# Patient Record
Sex: Male | Born: 1945 | Hispanic: No | Smoking: Never smoker
Health system: Southern US, Community
[De-identification: ages and names within clinical notes are randomized; demographics above are authoritative.]

## PROBLEM LIST (undated history)

## (undated) DIAGNOSIS — E119 Type 2 diabetes mellitus without complications: Secondary | ICD-10-CM

## (undated) DIAGNOSIS — J449 Chronic obstructive pulmonary disease, unspecified: Secondary | ICD-10-CM

## (undated) DIAGNOSIS — C801 Malignant (primary) neoplasm, unspecified: Secondary | ICD-10-CM

## (undated) DIAGNOSIS — I10 Essential (primary) hypertension: Secondary | ICD-10-CM

## (undated) HISTORY — PX: REPLACEMENT TOTAL KNEE: SUR1224

---

## 2019-07-28 ENCOUNTER — Other Ambulatory Visit (HOSPITAL_COMMUNITY): Payer: Self-pay | Admitting: Pulmonary Disease

## 2019-07-28 DIAGNOSIS — C3431 Malignant neoplasm of lower lobe, right bronchus or lung: Secondary | ICD-10-CM

## 2019-08-01 ENCOUNTER — Other Ambulatory Visit: Payer: Self-pay

## 2019-08-01 ENCOUNTER — Ambulatory Visit (HOSPITAL_COMMUNITY)
Admission: RE | Admit: 2019-08-01 | Discharge: 2019-08-01 | Disposition: A | Discharge status: Home / Self Care | Payer: No Typology Code available for payment source | Source: Ambulatory Visit | Attending: Pulmonary Disease | Admitting: Pulmonary Disease

## 2019-08-01 DIAGNOSIS — C3431 Malignant neoplasm of lower lobe, right bronchus or lung: Secondary | ICD-10-CM | POA: Diagnosis not present

## 2019-08-01 MED ORDER — FLUDEOXYGLUCOSE F - 18 (FDG) INJECTION
12.2000 | Freq: Once | INTRAVENOUS | Status: AC | PRN
  Administered 2019-08-01: 12.2 via INTRAVENOUS

## 2019-08-03 ENCOUNTER — Emergency Department (HOSPITAL_COMMUNITY): Payer: No Typology Code available for payment source

## 2019-08-03 ENCOUNTER — Other Ambulatory Visit: Payer: Self-pay

## 2019-08-03 ENCOUNTER — Observation Stay (HOSPITAL_COMMUNITY)
Admission: EM | Admit: 2019-08-03 | Discharge: 2019-08-04 | Disposition: A | Discharge status: Home / Self Care | Payer: No Typology Code available for payment source | Attending: Internal Medicine | Admitting: Internal Medicine

## 2019-08-03 ENCOUNTER — Encounter (HOSPITAL_COMMUNITY): Payer: Self-pay

## 2019-08-03 DIAGNOSIS — J42 Unspecified chronic bronchitis: Secondary | ICD-10-CM | POA: Diagnosis not present

## 2019-08-03 DIAGNOSIS — R079 Chest pain, unspecified: Secondary | ICD-10-CM | POA: Diagnosis present

## 2019-08-03 DIAGNOSIS — C7951 Secondary malignant neoplasm of bone: Secondary | ICD-10-CM | POA: Insufficient documentation

## 2019-08-03 DIAGNOSIS — R1084 Generalized abdominal pain: Secondary | ICD-10-CM | POA: Insufficient documentation

## 2019-08-03 DIAGNOSIS — C761 Malignant neoplasm of thorax: Principal | ICD-10-CM | POA: Insufficient documentation

## 2019-08-03 DIAGNOSIS — J9611 Chronic respiratory failure with hypoxia: Secondary | ICD-10-CM | POA: Diagnosis not present

## 2019-08-03 DIAGNOSIS — E119 Type 2 diabetes mellitus without complications: Secondary | ICD-10-CM | POA: Diagnosis not present

## 2019-08-03 DIAGNOSIS — J449 Chronic obstructive pulmonary disease, unspecified: Secondary | ICD-10-CM | POA: Diagnosis not present

## 2019-08-03 DIAGNOSIS — G9341 Metabolic encephalopathy: Secondary | ICD-10-CM | POA: Insufficient documentation

## 2019-08-03 DIAGNOSIS — C787 Secondary malignant neoplasm of liver and intrahepatic bile duct: Secondary | ICD-10-CM | POA: Diagnosis not present

## 2019-08-03 DIAGNOSIS — I10 Essential (primary) hypertension: Secondary | ICD-10-CM | POA: Diagnosis not present

## 2019-08-03 DIAGNOSIS — Z20828 Contact with and (suspected) exposure to other viral communicable diseases: Secondary | ICD-10-CM | POA: Diagnosis not present

## 2019-08-03 DIAGNOSIS — R072 Precordial pain: Secondary | ICD-10-CM

## 2019-08-03 DIAGNOSIS — C799 Secondary malignant neoplasm of unspecified site: Secondary | ICD-10-CM | POA: Diagnosis not present

## 2019-08-03 DIAGNOSIS — D72825 Bandemia: Secondary | ICD-10-CM

## 2019-08-03 DIAGNOSIS — R0789 Other chest pain: Secondary | ICD-10-CM

## 2019-08-03 HISTORY — DX: Type 2 diabetes mellitus without complications: E11.9

## 2019-08-03 HISTORY — DX: Essential (primary) hypertension: I10

## 2019-08-03 HISTORY — DX: Malignant (primary) neoplasm, unspecified: C80.1

## 2019-08-03 HISTORY — DX: Chronic obstructive pulmonary disease, unspecified: J44.9

## 2019-08-03 LAB — CBC WITH DIFFERENTIAL/PLATELET
Abs Immature Granulocytes: 0.25 10*3/uL — ABNORMAL HIGH (ref 0.00–0.07)
Basophils Absolute: 0.1 10*3/uL (ref 0.0–0.1)
Basophils Relative: 1 %
Eosinophils Absolute: 1.5 10*3/uL — ABNORMAL HIGH (ref 0.0–0.5)
Eosinophils Relative: 8 %
HCT: 46.8 % (ref 39.0–52.0)
Hemoglobin: 14.2 g/dL (ref 13.0–17.0)
Immature Granulocytes: 1 %
Lymphocytes Relative: 11 %
Lymphs Abs: 2.1 10*3/uL (ref 0.7–4.0)
MCH: 27.5 pg (ref 26.0–34.0)
MCHC: 30.3 g/dL (ref 30.0–36.0)
MCV: 90.5 fL (ref 80.0–100.0)
Monocytes Absolute: 2.1 10*3/uL — ABNORMAL HIGH (ref 0.1–1.0)
Monocytes Relative: 10 %
Neutro Abs: 13.9 10*3/uL — ABNORMAL HIGH (ref 1.7–7.7)
Neutrophils Relative %: 69 %
Platelets: 323 10*3/uL (ref 150–400)
RBC: 5.17 MIL/uL (ref 4.22–5.81)
RDW: 14.1 % (ref 11.5–15.5)
WBC: 19.9 10*3/uL — ABNORMAL HIGH (ref 4.0–10.5)
nRBC: 0 % (ref 0.0–0.2)

## 2019-08-03 LAB — PROTIME-INR
INR: 1.2 (ref 0.8–1.2)
Prothrombin Time: 15 seconds (ref 11.4–15.2)

## 2019-08-03 LAB — URINALYSIS, ROUTINE W REFLEX MICROSCOPIC
Bacteria, UA: NONE SEEN
Bilirubin Urine: NEGATIVE
Glucose, UA: NEGATIVE mg/dL
Hgb urine dipstick: NEGATIVE
Ketones, ur: 20 mg/dL — AB
Leukocytes,Ua: NEGATIVE
Nitrite: POSITIVE — AB
Protein, ur: NEGATIVE mg/dL
Specific Gravity, Urine: 1.013 (ref 1.005–1.030)
pH: 6 (ref 5.0–8.0)

## 2019-08-03 LAB — COMPREHENSIVE METABOLIC PANEL
ALT: 14 U/L (ref 0–44)
AST: 14 U/L — ABNORMAL LOW (ref 15–41)
Albumin: 3.2 g/dL — ABNORMAL LOW (ref 3.5–5.0)
Alkaline Phosphatase: 68 U/L (ref 38–126)
Anion gap: 13 (ref 5–15)
BUN: 10 mg/dL (ref 8–23)
CO2: 29 mmol/L (ref 22–32)
Calcium: 9.2 mg/dL (ref 8.9–10.3)
Chloride: 94 mmol/L — ABNORMAL LOW (ref 98–111)
Creatinine, Ser: 1.01 mg/dL (ref 0.61–1.24)
GFR calc Af Amer: >60 mL/min (ref >60)
GFR calc non Af Amer: >60 mL/min (ref >60)
Glucose, Bld: 111 mg/dL — ABNORMAL HIGH (ref 70–99)
Potassium: 4.1 mmol/L (ref 3.5–5.1)
Sodium: 136 mmol/L (ref 135–145)
Total Bilirubin: 1.4 mg/dL — ABNORMAL HIGH (ref 0.3–1.2)
Total Protein: 7.5 g/dL (ref 6.5–8.1)

## 2019-08-03 LAB — TROPONIN I (HIGH SENSITIVITY)
Troponin I (High Sensitivity): 26 ng/L — ABNORMAL HIGH (ref <18)
Troponin I (High Sensitivity): 26 ng/L — ABNORMAL HIGH (ref <18)

## 2019-08-03 LAB — GLUCOSE, CAPILLARY: Glucose-Capillary: 97 mg/dL (ref 70–99)

## 2019-08-03 LAB — LACTIC ACID, PLASMA
Lactic Acid, Venous: 1.4 mmol/L (ref 0.5–1.9)
Lactic Acid, Venous: 1.6 mmol/L (ref 0.5–1.9)

## 2019-08-03 LAB — LIPASE, BLOOD: Lipase: 20 U/L (ref 11–51)

## 2019-08-03 LAB — SARS CORONAVIRUS 2 BY RT PCR (HOSPITAL ORDER, PERFORMED IN ~~LOC~~ HOSPITAL LAB): SARS Coronavirus 2: NEGATIVE

## 2019-08-03 MED ORDER — SODIUM CHLORIDE 0.9 % IV SOLN
INTRAVENOUS | Status: DC
  Administered 2019-08-03 – 2019-08-04 (×2): via INTRAVENOUS

## 2019-08-03 MED ORDER — HYDROMORPHONE HCL 1 MG/ML IJ SOLN
1.0000 mg | Freq: Once | INTRAMUSCULAR | Status: AC
  Administered 2019-08-03: 1 mg via INTRAVENOUS
  Filled 2019-08-03: qty 1

## 2019-08-03 MED ORDER — IOHEXOL 350 MG/ML SOLN
100.0000 mL | Freq: Once | INTRAVENOUS | Status: AC | PRN
  Administered 2019-08-03: 15:00:00 100 mL via INTRAVENOUS

## 2019-08-03 MED ORDER — LORAZEPAM 2 MG/ML IJ SOLN
0.5000 mg | Freq: Once | INTRAMUSCULAR | Status: AC
  Administered 2019-08-03: 14:00:00 0.5 mg via INTRAVENOUS
  Filled 2019-08-03: qty 1

## 2019-08-03 MED ORDER — FENTANYL CITRATE (PF) 100 MCG/2ML IJ SOLN
50.0000 ug | Freq: Once | INTRAMUSCULAR | Status: AC
  Administered 2019-08-03: 11:00:00 50 ug via INTRAVENOUS
  Filled 2019-08-03: qty 2

## 2019-08-03 MED ORDER — HYDROCODONE-ACETAMINOPHEN 7.5-325 MG/15ML PO SOLN
10.0000 mL | ORAL | Status: DC | PRN
  Administered 2019-08-03 – 2019-08-04 (×2): 10 mL via ORAL
  Filled 2019-08-03 (×2): qty 15

## 2019-08-03 MED ORDER — ONDANSETRON HCL 4 MG/2ML IJ SOLN
4.0000 mg | Freq: Four times a day (QID) | INTRAMUSCULAR | Status: DC | PRN
  Administered 2019-08-03: 4 mg via INTRAVENOUS
  Filled 2019-08-03: qty 2

## 2019-08-03 MED ORDER — SODIUM CHLORIDE 0.9 % IV BOLUS
500.0000 mL | Freq: Once | INTRAVENOUS | Status: AC
  Administered 2019-08-03: 500 mL via INTRAVENOUS

## 2019-08-03 MED ORDER — INSULIN ASPART 100 UNIT/ML ~~LOC~~ SOLN
0.0000 [IU] | Freq: Three times a day (TID) | SUBCUTANEOUS | Status: DC
  Administered 2019-08-04: 13:00:00 1 [IU] via SUBCUTANEOUS

## 2019-08-03 MED ORDER — PANTOPRAZOLE SODIUM 40 MG PO TBEC
40.0000 mg | DELAYED_RELEASE_TABLET | Freq: Every day | ORAL | Status: DC
  Administered 2019-08-04: 10:00:00 40 mg via ORAL
  Filled 2019-08-03: qty 1

## 2019-08-03 MED ORDER — SIMVASTATIN 10 MG PO TABS: 10.0000 mg | ORAL_TABLET | Freq: Every day | ORAL | Status: DC

## 2019-08-03 MED ORDER — PREDNISONE 10 MG PO TABS
5.0000 mg | ORAL_TABLET | Freq: Every day | ORAL | Status: DC
  Administered 2019-08-04: 5 mg via ORAL
  Filled 2019-08-03: qty 1

## 2019-08-03 MED ORDER — ATENOLOL 25 MG PO TABS
12.5000 mg | ORAL_TABLET | Freq: Every day | ORAL | Status: DC
  Administered 2019-08-04: 10:00:00 12.5 mg via ORAL
  Filled 2019-08-03: qty 1

## 2019-08-03 MED ORDER — ENOXAPARIN SODIUM 40 MG/0.4ML ~~LOC~~ SOLN
40.0000 mg | SUBCUTANEOUS | Status: DC
  Administered 2019-08-03: 23:00:00 40 mg via SUBCUTANEOUS
  Filled 2019-08-03: qty 0.4

## 2019-08-03 MED ORDER — FENTANYL CITRATE (PF) 100 MCG/2ML IJ SOLN
100.0000 ug | Freq: Once | INTRAMUSCULAR | Status: AC
  Administered 2019-08-03: 15:00:00 100 ug via INTRAVENOUS
  Filled 2019-08-03: qty 2

## 2019-08-03 MED ORDER — SODIUM CHLORIDE 0.9 % IV SOLN
1.0000 g | Freq: Once | INTRAVENOUS | Status: AC
  Administered 2019-08-03: 17:00:00 1 g via INTRAVENOUS
  Filled 2019-08-03: qty 10

## 2019-08-03 MED ORDER — ASPIRIN EC 81 MG PO TBEC
81.0000 mg | DELAYED_RELEASE_TABLET | Freq: Every day | ORAL | Status: DC
  Administered 2019-08-04: 81 mg via ORAL
  Filled 2019-08-03: qty 1

## 2019-08-03 NOTE — H&P (Signed)
History and Physical:    Andrew Roman   UDJ:[id number] DOB: [birth year] DOA: 08/03/2019  Referring MD/provider: Dr. Roderic Palau PCP: [facility]   Patient coming from: Home  Chief Complaint: Somnolence after receiving Dilaudid in ED  History of Present Illness:   Andrew Roman is an 73 y.o. male with past medical history significant for hypertension diabetes and interstitial lung disease who was in his usual state of health until last month when an incidental CT done at the [facility] found a lung mass.  Patient was supposed to undergo further work-up however his son moved him from [place] to [place] to live closer by to him so the work-up was never done.  Over the past couple of days patient has had increasing pain in his substernal area as well as pain in his right upper quadrant.  According to his son patient does not like to take pain medications and had resisted coming to the hospital.  He has had significantly decreased p.o. intake over the past couple of days and he has not eaten anything or had anything to drink today.  Today apparently the pain was so bad that the patient requested an ambulance be called.  IHistory is entirely per patient's son Delfino Lovett as patient was unable to provide history.  ED Course:  In the ED patient was initially treated with fentanyl 100 with modest improvement in pain.  He underwent chest abdomen and pelvic CT which showed widely metastatic disease in chest and abdomen with large mets in his liver which would be consistent with his right upper quadrant pain.  And was to discharge patient home for outpatient follow-up however patient received 1 mg of Dilaudid prior to going home and was found to be very sleepy and unable to go home safely.  Because he was in so much pain it was felt that Narcan would not be appropriate at this time so tried hospitalist were called to admit patient for observation.    ROS:   ROS  Patient unable to  provide review of systems  Past Medical History:   Past Medical History:  Diagnosis Date   Cancer ([place])    lung mass   COPD (chronic obstructive pulmonary disease) ([place])    lung disease   Diabetes mellitus without complication ([place])    Hypertension     Past Surgical History:   History reviewed. No pertinent surgical history.  Social History:   Social History   Socioeconomic History   Marital status: Unknown    Spouse name: Not on file   Number of children: Not on file   Years of education: Not on file   Highest education level: Not on file  Occupational History   Not on file  Social Needs   Financial resource strain: Not on file   Food insecurity    Worry: Not on file    Inability: Not on file   Transportation needs    Medical: Not on file    Non-medical: Not on file  Tobacco Use   Smoking status: Never Smoker   Smokeless tobacco: Never Used  Substance and Sexual Activity   Alcohol use: Never    Frequency: Never   Drug use: Never   Sexual activity: Not on file  Lifestyle   Physical activity    Days per week: Not on file    Minutes per session: Not on file   Stress: Not on file  Relationships   Social connections    Talks on  phone: Not on file    Gets together: Not on file    Attends religious service: Not on file    Active member of club or organization: Not on file    Attends meetings of clubs or organizations: Not on file    Relationship status: Not on file   Intimate partner violence    Fear of current or ex partner: Not on file    Emotionally abused: Not on file    Physically abused: Not on file    Forced sexual activity: Not on file  Other Topics Concern   Not on file  Social History Narrative   Not on file    Allergies   Morphine and related  Family history:   No family history on file.  Current Medications:   Prior to Admission medications   Medication Sig Start Date End Date Taking? Authorizing Provider    aspirin EC 81 MG tablet Take 81 mg by mouth daily.   Yes [provider]  atenolol (TENORMIN) 25 MG tablet Take 12.5 mg by mouth daily.    Yes [provider]  insulin NPH-regular Human (70-30) 100 UNIT/ML injection Inject 8 Units into the skin 2 (two) times daily with a meal.   Yes [provider]  Multiple Vitamin (MULTIVITAMIN) tablet Take 1 tablet by mouth daily.   Yes [provider]  omeprazole (PRILOSEC) 40 MG capsule Take 40 mg by mouth daily.   Yes [provider]  predniSONE (DELTASONE) 5 MG tablet Take 5 mg by mouth daily with breakfast.   Yes [provider]  simvastatin (ZOCOR) 10 MG tablet Take 10 mg by mouth at bedtime.   Yes [provider]    Physical Exam:   Vitals:   08/03/19 1700 08/03/19 1730 08/03/19 1743 08/03/19 1800  BP: 123/84 118/80 118/80 123/84  Pulse: (!) 120 (!) 115 (!) 117 (!) 114  Resp: (!) 42 18 (!) 22 17  Temp:   98.7 F (37.1 C)   TempSrc:   Oral   SpO2: (!) 89% 98% 100% 100%  Weight:      Height:         Physical Exam: Blood pressure 123/84, pulse (!) 114, temperature 98.7 F (37.1 C), temperature source Oral, resp. rate 17, height 5\' 7"  (1.702 m), weight 83.9 kg, SpO2 100 %. Gen: Elderly man looking older than stated age lying at 30 degrees in no acute respiratory distress.  He does open eyes to voice however his eyes are somewhat unfocused, goes back to sleep. Eyes: Sclerae anicteric. Conjunctiva mildly injected. Chest: Decreased air entry bilaterally likely secondary to decreased inspiratory effort CV: Distant, regular, no audible murmurs. Abdomen: Patient has normoactive bowel sounds.  His abdomen is soft.  He has tenderness to light palpation in the right upper quadrant. Extremities: No edema.  Skin: Warm and dry. No rashes, lesions or wounds. Neuro: Patient is sleepy.  GCS is 10.  Patient apparently was awake and alert and coherent until he got the Dilaudid per his son's  report.   Psych: Unable to assess patient psychiatric status.  Data Review:    Labs: Basic Metabolic Panel: Recent Labs  Lab 08/03/19 1111  NA 136  K 4.1  CL 94*  CO2 29  GLUCOSE 111*  BUN 10  CREATININE 1.01  CALCIUM 9.2   Liver Function Tests: Recent Labs  Lab 08/03/19 1111  AST 14*  ALT 14  ALKPHOS 68  BILITOT 1.4*  PROT 7.5  ALBUMIN 3.2*  Recent Labs  Lab 08/03/19 1111  LIPASE 20   No results for input(s): AMMONIA in the last 168 hours. CBC: Recent Labs  Lab 08/03/19 1111  WBC 19.9*  NEUTROABS 13.9*  HGB 14.2  HCT 46.8  MCV 90.5  PLT 323   Cardiac Enzymes: No results for input(s): CKTOTAL, CKMB, CKMBINDEX, TROPONINI in the last 168 hours.  BNP (last 3 results) No results for input(s): PROBNP in the last 8760 hours. CBG: No results for input(s): GLUCAP in the last 168 hours.  Urinalysis    Component Value Date/Time   COLORURINE YELLOW 08/03/2019 1112   APPEARANCEUR CLEAR 08/03/2019 1112   LABSPEC 1.013 08/03/2019 1112   PHURINE 6.0 08/03/2019 1112   GLUCOSEU NEGATIVE 08/03/2019 1112   HGBUR NEGATIVE 08/03/2019 1112   BILIRUBINUR NEGATIVE 08/03/2019 1112   KETONESUR 20 (A) 08/03/2019 1112   PROTEINUR NEGATIVE 08/03/2019 1112   NITRITE POSITIVE (A) 08/03/2019 1112   LEUKOCYTESUR NEGATIVE 08/03/2019 1112      Radiographic Studies: Ct Angio Chest Pe W And/or Wo Contrast  Result Date: 08/03/2019 CLINICAL DATA:  Right-sided chest pain. EXAM: CT ANGIOGRAPHY CHEST WITH CONTRAST TECHNIQUE: Multidetector CT imaging of the chest was performed using the standard protocol during bolus administration of intravenous contrast. Multiplanar CT image reconstructions and MIPs were obtained to evaluate the vascular anatomy. CONTRAST:  178mL OMNIPAQUE IOHEXOL 350 MG/ML SOLN COMPARISON:  Chest x-ray dated 08/03/2019 FINDINGS: Cardiovascular: There are no pulmonary emboli. However, there is a posterior mediastinal mass at the level of the left atrium which  invades the left atrium. There is a 3 x 2 x 2 cm irregular mass in the posterior aspect of the left atrium representing a direct extension of the tumor in the posterior mediastinum into the atrium. Overall heart size is normal. No pericardial effusion. Slight coronary artery calcifications. Mediastinum/Nodes: There is right hilar and subcarinal adenopathy. There is a 6.6 x 6.6 x 4.4 cm posterior mediastinal mass immediately posterior to the left atrium extending to the diaphragmatic hiatus immediately adjacent to the esophagus. The fat planes between the distal esophagus and this mass are obscured. The thyroid gland and trachea appear normal. Lungs/Pleura: The mass in the posterior mediastinum extends into the left hilum. There is narrowing of the right middle lobe and lower lobe bronchi. There is peripheral interstitial disease throughout both lungs, most prominent posteriorly at the right lung base. No discrete effusions. Upper Abdomen: There is adenopathy around the porta hepatis and around the inferior vena cava just below the liver. Musculoskeletal: There is a slight anterior wedge deformity of there is also a 9 mm lytic lesion in the superior aspect of T7, likely a benign Schmorl's node. PET scan does show metastatic disease in the sclerotic left pedicle of T7. There is a destructive expansile lesion of the anterior aspect of the right third rib. The PET scan demonstrated numerous metastatic lesions in the ribs and spine as well as in the scapulae. There is a subtle lytic lesion in the posteromedial aspect of the left ninth rib. Review of the MIP images confirms the above findings. IMPRESSION: 1. No pulmonary emboli. 2. Large mass in the posterior mediastinum extending into the left hilum with invasion of the left atrium. 3. Right hilar and subcarinal adenopathy consistent with metastatic disease. 4. Metastatic disease to the bones as described. 5. Probable benign Schmorl's node in the superior aspect of the  T7 vertebral body. Electronically Signed   By: Lorriane Shire M.D.   On: 08/03/2019 15:37  Ct Abdomen Pelvis W Contrast  Result Date: 08/03/2019 CLINICAL DATA:  Acute generalized abdominal pain. Foot EXAM: CT ABDOMEN AND PELVIS WITH CONTRAST TECHNIQUE: Multidetector CT imaging of the abdomen and pelvis was performed using the standard protocol following bolus administration of intravenous contrast. CONTRAST:  123mL OMNIPAQUE IOHEXOL 350 MG/ML SOLN COMPARISON:  None. FINDINGS: Lower chest: Posterior mediastinal mass invading the left atrium. Adenopathy adjacent to the distal esophagus just above the diaphragmatic hiatus. Hepatobiliary: There are 3 discrete metastases in the liver which correlate with the PET-CT scan. There is a 2 cm mass in the posterior aspect of the dome of the right lobe of the liver. There is a 17 mm lesion in the periphery of the right lobe and there is a 16 mm lesion in the inferior aspect of the left lobe of the liver. There is a single calcified gallstone. No dilated bile ducts. Prominent caudate lobe. Pancreas: Normal. Spleen: Normal. 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 Left adrenal gland appears normal. Bilateral renal cysts as demonstrated on the prior PET-CT scan. No hydronephrosis. Bladder appears normal. Stomach/Bowel: Stomach is within normal limits. Appendix appears normal. No evidence of bowel wall thickening, distention, or inflammatory changes. Multiple diverticula in the distal colon. The appendix is not discretely identified. Vascular/Lymphatic: Aortic atherosclerosis. Multiple enlarged lymph nodes in the porta hepatis and adjacent to the celiac axis. Reproductive: Prostate is unremarkable. Other: No abdominal wall hernia or abnormality. No abdominopelvic ascites. Musculoskeletal: The multiple metastatic lesions in the spine, pelvic bones, and proximal left femur are quite subtle on the CT scan. While the proximal left femur lesions has a vague area of  increased density best seen on image 102 of series 5. No discrete destructive bone lesions. IMPRESSION: 1. 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 2. Multiple enlarged lymph nodes in the porta hepatis and adjacent to the celiac axis. 3. Multiple metastatic lesions in the spine, pelvic bones, and proximal left femur. 4. No acute abnormalities in the abdomen as compared to the prior PET-CT. Aortic Atherosclerosis (ICD10-I70.0). Electronically Signed   By: Lorriane Shire M.D.   On: 08/03/2019 15:47   Nm Pet Image Initial (pi) Skull Base To Thigh  Result Date: 08/01/2019 CLINICAL DATA:  Initial treatment strategy for right lower lobe lung malignancy. EXAM: NUCLEAR MEDICINE PET SKULL BASE TO THIGH TECHNIQUE: 12.2 mCi F-18 FDG was injected intravenously. Full-ring PET imaging was performed from the skull base to thigh after the radiotracer. CT data was obtained and used for attenuation correction and anatomic localization. Fasting blood glucose: 110 mg/dl COMPARISON:  None. FINDINGS: Mediastinal blood pool activity: SUV max 3.3 Liver activity: SUV max NA NECK: Hypermetabolic mildly enlarged bilateral level II neck lymph nodes, for example measuring 1.0 cm on the left with max SUV 4.8 (series 3/image 36). Small focus of hypermetabolism in the right submandibular musculature with max SUV 7.0, without discrete CT correlate. Incidental CT findings: none CHEST: Hypermetabolic solid 5.7 x 4.8 cm lung mass in the paramediastinal right lower lobe with probable direct mediastinal invasion with max SUV 16.4 (series 3/image 88). There is patchy irregular thickening of peribronchovascular interstitium throughout both lungs without additional foci of pulmonary hypermetabolism. Hypermetabolic 0.7 cm right axillary node with max SUV 3.9 (series 3/image 58). Hypermetabolic enlarged 1.3 cm subcarinal node with max SUV 7.7 (series 3/image 75). No hypermetabolic left axillary or hilar nodes. Hypermetabolic 1.1  cm soft tissue nodule posterior to the right pectoralis musculature with max SUV 5.3 (series 3/image 95).  Incidental CT findings: Coronary atherosclerosis. Mildly atherosclerotic nonaneurysmal thoracic aorta. ABDOMEN/PELVIS: Three hypermetabolic liver lesions scattered in the liver, including a 1.3 cm posterior right liver lesion with max SUV 9.4 (series 3/image 100), a posterior right liver dome 1.3 cm lesion with max SUV 7.6 (series 3/image 87) and a 1.1 cm segment 3 left liver lobe lesion with max SUV 7.7 (series 3/image 116). Hypermetabolic 2.4 cm right adrenal nodule with max SUV 15.7 (series 3/image 117). No hypermetabolic left adrenal nodules. Two hypermetabolic soft tissue implants in the omentum, largest 1.4 cm in the right omentum with max SUV 8.4 (series 3/image 159). Hypermetabolic high right retroperitoneal 1.2 cm lymph node with max SUV 4.9 (series 3/image 117). No additional hypermetabolic abdominopelvic nodes. Bilateral medial gluteal musculature soft tissue foci of hypermetabolism, largest 1.1 cm on the left with max SUV 7.4 (series 3/image 253). No abnormal hypermetabolic activity within the pancreas or spleen. Incidental CT findings: Cholelithiasis. Atherosclerotic nonaneurysmal abdominal aorta. Punctate nonobstructing interpolar right renal stone. Mild sigmoid diverticulosis. SKELETON: Numerous hypermetabolic osseous lesions scattered throughout the axial and proximal appendicular skeleton, which are relatively CT occult. Representative lesions as follows: -inferior right scapular lesion with max SUV 11.6 -anterior left third rib lesion with max SUV 13.5 -proximal right humeral metaphysis lesion with max SUV 10.6 -right iliac crest lesion with max SUV 8.6 -left femoral neck lesion with max SUV 9.2 -L1 vertebral lesion with max SUV 13.4 Incidental CT findings: none IMPRESSION: 1. Intensely hypermetabolic (max SUV 64.4) 5.7 cm lung mass in the paramediastinal right lower lobe, compatible with  primary bronchogenic carcinoma, with probable direct mediastinal invasion. 2. Hypermetabolic subcarinal lymphadenopathy. 3. Irregular patchy thickening of the peribronchovascular interstitium throughout both lungs, nonspecific, lymphangitic carcinomatosis not excluded. 4. Hypermetabolic distant lymphadenopathy in the bilateral level II neck, right axilla and high right retroperitoneum. 5. Multiple hypermetabolic liver metastases. 6. Hypermetabolic right adrenal metastasis. 7. Hypermetabolic omental metastases. 8. Scattered hypermetabolic soft tissue metastases in right chest wall and bilateral gluteal musculature. 9. Widespread hypermetabolic osseous metastatic disease throughout the axial and proximal appendicular skeleton. Hypermetabolic left femoral neck metastasis places the patient at risk for pathologic fracture. 10. PET/CT staging is stage IVB (T4 N2 M1c). 11. Chronic findings include: Aortic Atherosclerosis (ICD10-I70.0). Coronary atherosclerosis. Cholelithiasis. Mild sigmoid diverticulosis. Electronically Signed   By: Ilona Sorrel M.D.   On: 08/01/2019 22:18   Dg Chest Portable 1 View  Result Date: 08/03/2019 CLINICAL DATA:  Chest pain. EXAM: PORTABLE CHEST 1 VIEW COMPARISON:  None. FINDINGS: Mild cardiomegaly is noted. Hypoinflation of the lungs is noted. No pneumothorax is noted. Small pleural effusions may be present. Bilateral lung opacities are noted concerning for possible edema or inflammation. Bony thorax is unremarkable. IMPRESSION: Hypoinflation of the lungs is noted. Small pleural effusions may be present. Bilateral interstitial lung opacities are noted which may represent edema or inflammation. Electronically Signed   By: Marijo Conception M.D.   On: 08/03/2019 11:40    EKG: Independently reviewed.  Sinus tach at 100.  Right axis deviation.  Q in 3 with insignificant Q in F.  No acute ST-T wave changes.  Poor R wave progression.   Assessment/Plan:   Principal Problem:   Metastatic  cancer ([place]) Active Problems:   COPD (chronic obstructive pulmonary disease) ([facility])   Hypertension   Diabetes mellitus without complication ([place])  73 yo M presents with chest pain, found to have widely metastatic disease in chest and abdomen. Has not tolerated narcotics pain meds given in ED well, is  quite somnolent now and unable to go home safely. Will admit overnight for observation.   ALTERED MENTAL STATUS Patient has been doing well until he received the Dilaudid 1 mg No indication for Narcan as patient is in significant pain and he is able to protect his airway and is slowly waking up apparently. Will try to avoid iatrogenesis, keep head of bed 30 degrees to avoid aspiration, falls risk protocol.  METASTATIC CANCER Patient was discussed with Dr. Cheri Kearns who will see the patient in consultation in the morning. Overnight if patient needs pain medications he might do better with fentanyl and then Dilaudid.  LEUKOCYTOSIS Most likely secondary to malignancy. No real evidence for any infection although he does have nitrite positive in the urine although no white cells and no symptoms so will not treat at present.  DM Patient has not taken his insulin today, nor has he had any food or water all day today. Blood sugar is only 111 today despite that. Given that patient is only on a very low dose of insulin of 70/30 insulin 8 u BID, will place patient on sliding scale insulin AC at bedtime to control blood sugars in house.   Consider defying home insulin dosage upon discharge if warranted. Will also provide low-dose hydration today given that he has had no oral intake at all all day and he does have some tachycardia.  HTN Continue atenolol per home doses, first dose to be given in the morning.  INTERSTITIAL LUNG DISEASE  Continue prednisone 5 mg daily to be administered in the morning, no need for stress dose steroids at this point.    Other information:   DVT prophylaxis:  Lovenox ordered. Code Status: Full code. Family Communication: Spoke with patient's son Delfino Lovett at [employer].   Disposition Plan: Home tomorrow Consults called: Oncology to be seen in the morning Admission status: Observation   The medical decision making is of moderate complexity, therefore this is a level 2 visit.  Dewaine Oats Tublu Aidynn Krenn [facility]  If 7PM-7AM, please contact night-coverage [email or web address] Password [email or web address] 08/03/2019, 6:11 PM

## 2019-08-03 NOTE — ED Provider Notes (Signed)
Emergency Department Provider Note   I have reviewed the triage vital signs and the nursing notes.   HISTORY  Chief Complaint Chest Pain   HPI Andrew Roman is a 73 y.o. male with PMH of DM, HTN, COPD on 5L home O2, and new chest mass currently establishing care with oncology presents to the emergency department with worsening right-sided chest pain, decreased appetite, generalized weakness.  Patient is here with his son at bedside who checks in on him frequently.  He is primarily followed by the [facility].  Patient states he has intermittently had pain in his chest but overnight the pain became worse which prompted him to alert his son who called EMS.  The son states he is not been eating or drinking much over the last several days and has not had his insulin this morning.  Patient denies any fevers or shaking chills.  Denies cough or hemoptysis.  Son states that they are waiting on the results of the PET scan and are meeting with oncology later this week.  He is not currently on chemotherapy.  He denies abdominal pain, vomiting, diarrhea.  He does have some nausea. Denies sick contacts.    Past Medical History:  Diagnosis Date   Cancer ([place])    lung mass   COPD (chronic obstructive pulmonary disease) ([place])    lung disease   Diabetes mellitus without complication ([place])    Hypertension     There are no active problems to display for this patient.   History reviewed. No pertinent surgical history.  Allergies Morphine and related  No family history on file.  Social History Social History   Tobacco Use   Smoking status: Never Smoker   Smokeless tobacco: Never Used  Substance Use Topics   Alcohol use: Never    Frequency: Never   Drug use: Never    Review of Systems  Constitutional: No fever/chills. Positive generalized weakness.  Eyes: No visual changes. ENT: No sore throat. Cardiovascular: Positive chest pain. Respiratory: Denies shortness  of breath. Gastrointestinal: Mild right sided abdominal pain. Positive nausea, no vomiting.  No diarrhea.  No constipation. Genitourinary: Negative for dysuria. Musculoskeletal: Negative for back pain. Skin: Negative for rash. Neurological: Negative for headaches, focal weakness or numbness.  10-point ROS otherwise negative.  ____________________________________________   PHYSICAL EXAM:  VITAL SIGNS: ED Triage Vitals  Enc Vitals Group     BP 08/03/19 1100 (!) 141/85     Pulse Rate 08/03/19 1100 (!) 103     Resp 08/03/19 1100 (!) 22     Temp 08/03/19 1100 98.6 F (37 C)     Temp Source 08/03/19 1100 Oral     SpO2 08/03/19 1100 100 %     Weight 08/03/19 1056 185 lb (83.9 kg)     Height 08/03/19 1056 5\' 7"  (1.702 m)   Constitutional: Alert and oriented. Well appearing and in no acute distress. Eyes: Conjunctivae are normal.  Head: Atraumatic. Nose: No congestion/rhinnorhea. Mouth/Throat: Mucous membranes are moist.   Neck: No stridor.   Cardiovascular: Tachycardia. Good peripheral circulation. Grossly normal heart sounds.   Respiratory: Normal respiratory effort.  No retractions. Lungs CTAB. Gastrointestinal: Soft with mild right abdominal tenderness to palpation. No distention.  Musculoskeletal: No lower extremity tenderness nor edema. No gross deformities of extremities. Neurologic:  Normal speech and language.  Skin:  Skin is warm, dry and intact. No rash noted.  ____________________________________________   LABS (all labs ordered are listed, but only abnormal results are  displayed)  Labs Reviewed  COMPREHENSIVE METABOLIC PANEL - Abnormal; Notable for the following components:      Result Value   Chloride 94 (*)    Glucose, Bld 111 (*)    Albumin 3.2 (*)    AST 14 (*)    Total Bilirubin 1.4 (*)    All other components within normal limits  CBC WITH DIFFERENTIAL/PLATELET - Abnormal; Notable for the following components:   WBC 19.9 (*)    Neutro Abs 13.9 (*)     Monocytes Absolute 2.1 (*)    Eosinophils Absolute 1.5 (*)    Abs Immature Granulocytes 0.25 (*)    All other components within normal limits  URINALYSIS, ROUTINE W REFLEX MICROSCOPIC - Abnormal; Notable for the following components:   Ketones, ur 20 (*)    Nitrite POSITIVE (*)    All other components within normal limits  TROPONIN I (HIGH SENSITIVITY) - Abnormal; Notable for the following components:   Troponin I (High Sensitivity) 26 (*)    All other components within normal limits  TROPONIN I (HIGH SENSITIVITY) - Abnormal; Notable for the following components:   Troponin I (High Sensitivity) 26 (*)    All other components within normal limits  CULTURE, BLOOD (ROUTINE X 2)  CULTURE, BLOOD (ROUTINE X 2)  SARS CORONAVIRUS 2 (HOSPITAL ORDER, [place] [facility])  URINE CULTURE  LIPASE, BLOOD  LACTIC ACID, PLASMA  LACTIC ACID, PLASMA  PROTIME-INR   ____________________________________________  EKG   EKG Interpretation  Date/Time:  Wednesday August 03 2019 10:58:12 EDT Ventricular Rate:  105 PR Interval:    QRS Duration: 102 QT Interval:  349 QTC Calculation: 459 R Axis:   132 Text Interpretation:  Sinus tachycardia Right axis deviation Low voltage, precordial leads T wave flattening laterally.  No prior for comparison. No STEMI.  Confirmed by Nanda Quinton [phone]) on 08/03/2019 11:03:24 AM       ____________________________________________  RADIOLOGY  Ct Angio Chest Pe W And/or Wo Contrast  Result Date: 08/03/2019 CLINICAL DATA:  Right-sided chest pain. EXAM: CT ANGIOGRAPHY CHEST WITH CONTRAST TECHNIQUE: Multidetector CT imaging of the chest was performed using the standard protocol during bolus administration of intravenous contrast. Multiplanar CT image reconstructions and MIPs were obtained to evaluate the vascular anatomy. CONTRAST:  160mL OMNIPAQUE IOHEXOL 350 MG/ML SOLN COMPARISON:  Chest x-ray dated 08/03/2019 FINDINGS: Cardiovascular: There are  no pulmonary emboli. However, there is a posterior mediastinal mass at the level of the left atrium which invades the left atrium. There is a 3 x 2 x 2 cm irregular mass in the posterior aspect of the left atrium representing a direct extension of the tumor in the posterior mediastinum into the atrium. Overall heart size is normal. No pericardial effusion. Slight coronary artery calcifications. Mediastinum/Nodes: There is right hilar and subcarinal adenopathy. There is a 6.6 x 6.6 x 4.4 cm posterior mediastinal mass immediately posterior to the left atrium extending to the diaphragmatic hiatus immediately adjacent to the esophagus. The fat planes between the distal esophagus and this mass are obscured. The thyroid gland and trachea appear normal. Lungs/Pleura: The mass in the posterior mediastinum extends into the left hilum. There is narrowing of the right middle lobe and lower lobe bronchi. There is peripheral interstitial disease throughout both lungs, most prominent posteriorly at the right lung base. No discrete effusions. Upper Abdomen: There is adenopathy around the porta hepatis and around the inferior vena cava just below the liver. Musculoskeletal: There is a slight anterior wedge  deformity of there is also a 9 mm lytic lesion in the superior aspect of T7, likely a benign Schmorl's node. PET scan does show metastatic disease in the sclerotic left pedicle of T7. There is a destructive expansile lesion of the anterior aspect of the right third rib. The PET scan demonstrated numerous metastatic lesions in the ribs and spine as well as in the scapulae. There is a subtle lytic lesion in the posteromedial aspect of the left ninth rib. Review of the MIP images confirms the above findings. IMPRESSION: 1. No pulmonary emboli. 2. Large mass in the posterior mediastinum extending into the left hilum with invasion of the left atrium. 3. Right hilar and subcarinal adenopathy consistent with metastatic disease. 4.  Metastatic disease to the bones as described. 5. Probable benign Schmorl's node in the superior aspect of the T7 vertebral body. Electronically Signed   By: Lorriane Shire M.D.   On: 08/03/2019 15:37   Ct Abdomen Pelvis W Contrast  Result Date: 08/03/2019 CLINICAL DATA:  Acute generalized abdominal pain. Foot EXAM: CT ABDOMEN AND PELVIS WITH CONTRAST TECHNIQUE: Multidetector CT imaging of the abdomen and pelvis was performed using the standard protocol following bolus administration of intravenous contrast. CONTRAST:  166mL OMNIPAQUE IOHEXOL 350 MG/ML SOLN COMPARISON:  None. FINDINGS: Lower chest: Posterior mediastinal mass invading the left atrium. Adenopathy adjacent to the distal esophagus just above the diaphragmatic hiatus. Hepatobiliary: There are 3 discrete metastases in the liver which correlate with the PET-CT scan. There is a 2 cm mass in the posterior aspect of the dome of the right lobe of the liver. There is a 17 mm lesion in the periphery of the right lobe and there is a 16 mm lesion in the inferior aspect of the left lobe of the liver. There is a single calcified gallstone. No dilated bile ducts. Prominent caudate lobe. Pancreas: Normal. Spleen: Normal. 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 Left adrenal gland appears normal. Bilateral renal cysts as demonstrated on the prior PET-CT scan. No hydronephrosis. Bladder appears normal. Stomach/Bowel: Stomach is within normal limits. Appendix appears normal. No evidence of bowel wall thickening, distention, or inflammatory changes. Multiple diverticula in the distal colon. The appendix is not discretely identified. Vascular/Lymphatic: Aortic atherosclerosis. Multiple enlarged lymph nodes in the porta hepatis and adjacent to the celiac axis. Reproductive: Prostate is unremarkable. Other: No abdominal wall hernia or abnormality. No abdominopelvic ascites. Musculoskeletal: The multiple metastatic lesions in the spine, pelvic bones, and  proximal left femur are quite subtle on the CT scan. While the proximal left femur lesions has a vague area of increased density best seen on image 102 of series 5. No discrete destructive bone lesions. IMPRESSION: 1. 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 2. Multiple enlarged lymph nodes in the porta hepatis and adjacent to the celiac axis. 3. Multiple metastatic lesions in the spine, pelvic bones, and proximal left femur. 4. No acute abnormalities in the abdomen as compared to the prior PET-CT. Aortic Atherosclerosis (ICD10-I70.0). Electronically Signed   By: Lorriane Shire M.D.   On: 08/03/2019 15:47   Dg Chest Portable 1 View  Result Date: 08/03/2019 CLINICAL DATA:  Chest pain. EXAM: PORTABLE CHEST 1 VIEW COMPARISON:  None. FINDINGS: Mild cardiomegaly is noted. Hypoinflation of the lungs is noted. No pneumothorax is noted. Small pleural effusions may be present. Bilateral lung opacities are noted concerning for possible edema or inflammation. Bony thorax is unremarkable. IMPRESSION: Hypoinflation of the lungs is noted. Small pleural effusions  may be present. Bilateral interstitial lung opacities are noted which may represent edema or inflammation. Electronically Signed   By: Marijo Conception M.D.   On: 08/03/2019 11:40    ____________________________________________   PROCEDURES  Procedure(s) performed:   Procedures  None ____________________________________________   INITIAL IMPRESSION / ASSESSMENT AND PLAN / ED COURSE  Pertinent labs & imaging results that were available during my care of the patient were reviewed by me and considered in my medical decision making (see chart for details).   Patient with new chest mass presents to the emergency department with worsening chest pain, decreased p.o. intake, nausea.  Patient is tachycardic with mild tachypnea on arrival.  He is on his baseline home oxygen.  Lower suspicion for COVID-19.  PE is a consideration along  with ACS, acute dehydration.  Will obtain screening lab work, chest x-ray, COVID testing.  Patient will likely require CT imaging following labs.  01:27 PM  Called to bedside with patient refusing CT scan.  He tells me that he is claustrophobic and "I just want to get out of here."  Had Maylynn Orzechowski discussion regarding my concern with her possible severe underlying disease process and that CT would help [place] to diagnose that accurately.  Patient's son is at bedside during discussion.  Patient agrees to accept a low dose Ativan for claustrophobia and CT and return to attempt the scan.   UA with positive nitrite but no other findings to suggest acute UTI. Will send for Cx.   CT pending. Patient sent back with Ativan for anxiety symptoms. Care transferred to Dr. Roderic Palau.  ____________________________________________  FINAL CLINICAL IMPRESSION(S) / ED DIAGNOSES  Final diagnoses:  Precordial pain     MEDICATIONS GIVEN DURING THIS VISIT:  Medications  HYDROmorphone (DILAUDID) injection 1 mg (has no administration in time range)  cefTRIAXone (ROCEPHIN) 1 g in sodium chloride 0.9 % 100 mL IVPB (has no administration in time range)  sodium chloride 0.9 % bolus 500 mL (0 mLs Intravenous Stopped 08/03/19 1224)  fentaNYL (SUBLIMAZE) injection 50 mcg (50 mcg Intravenous Given 08/03/19 1125)  iohexol (OMNIPAQUE) 350 MG/ML injection 100 mL (100 mLs Intravenous Contrast Given 08/03/19 1455)  LORazepam (ATIVAN) injection 0.5 mg (0.5 mg Intravenous Given 08/03/19 1341)  fentaNYL (SUBLIMAZE) injection 100 mcg (100 mcg Intravenous Given 08/03/19 1432)    Note:  This document was prepared using Dragon voice recognition software and may include unintentional dictation errors.  Nanda Quinton, MD Emergency Medicine    Tevon Berhane, [facility], MD 08/03/19 [phone]

## 2019-08-03 NOTE — ED Triage Notes (Addendum)
EMS reports pt had a mass in chest approx 4 weeks ago that is still being evaluated.  EMS reports pt hasn't eaten or drank much in the past 3-4 days.  C/O generalized weakness and r sided chest pain.  Pt treated at [facility].  Per ems, pt wears 5liters of o2 at home.

## 2019-08-03 NOTE — Discharge Instructions (Signed)
Follow-up with Dr. Delton Coombes tomorrow here at 12:30 PM

## 2019-08-04 ENCOUNTER — Observation Stay (HOSPITAL_COMMUNITY): Payer: No Typology Code available for payment source

## 2019-08-04 ENCOUNTER — Ambulatory Visit (HOSPITAL_COMMUNITY): Payer: No Typology Code available for payment source | Admitting: Hematology

## 2019-08-04 DIAGNOSIS — J9611 Chronic respiratory failure with hypoxia: Secondary | ICD-10-CM | POA: Diagnosis not present

## 2019-08-04 DIAGNOSIS — E119 Type 2 diabetes mellitus without complications: Secondary | ICD-10-CM | POA: Diagnosis not present

## 2019-08-04 DIAGNOSIS — I10 Essential (primary) hypertension: Secondary | ICD-10-CM

## 2019-08-04 DIAGNOSIS — R072 Precordial pain: Secondary | ICD-10-CM | POA: Diagnosis not present

## 2019-08-04 DIAGNOSIS — C799 Secondary malignant neoplasm of unspecified site: Secondary | ICD-10-CM

## 2019-08-04 DIAGNOSIS — D72825 Bandemia: Secondary | ICD-10-CM

## 2019-08-04 DIAGNOSIS — J449 Chronic obstructive pulmonary disease, unspecified: Secondary | ICD-10-CM

## 2019-08-04 LAB — CBC
HCT: 42.5 % (ref 39.0–52.0)
Hemoglobin: 13 g/dL (ref 13.0–17.0)
MCH: 27.8 pg (ref 26.0–34.0)
MCHC: 30.6 g/dL (ref 30.0–36.0)
MCV: 91 fL (ref 80.0–100.0)
Platelets: 300 10*3/uL (ref 150–400)
RBC: 4.67 MIL/uL (ref 4.22–5.81)
RDW: 13.7 % (ref 11.5–15.5)
WBC: 22.3 10*3/uL — ABNORMAL HIGH (ref 4.0–10.5)
nRBC: 0 % (ref 0.0–0.2)

## 2019-08-04 LAB — URINE CULTURE: Special Requests: NORMAL

## 2019-08-04 LAB — BASIC METABOLIC PANEL
Anion gap: 11 (ref 5–15)
BUN: 12 mg/dL (ref 8–23)
CO2: 28 mmol/L (ref 22–32)
Calcium: 8.8 mg/dL — ABNORMAL LOW (ref 8.9–10.3)
Chloride: 98 mmol/L (ref 98–111)
Creatinine, Ser: 0.95 mg/dL (ref 0.61–1.24)
GFR calc Af Amer: >60 mL/min (ref >60)
GFR calc non Af Amer: >60 mL/min (ref >60)
Glucose, Bld: 110 mg/dL — ABNORMAL HIGH (ref 70–99)
Potassium: 4.5 mmol/L (ref 3.5–5.1)
Sodium: 137 mmol/L (ref 135–145)

## 2019-08-04 LAB — GLUCOSE, CAPILLARY
Glucose-Capillary: 86 mg/dL (ref 70–99)
Glucose-Capillary: 144 mg/dL — ABNORMAL HIGH (ref 70–99)

## 2019-08-04 MED ORDER — IOHEXOL 300 MG/ML  SOLN
75.0000 mL | Freq: Once | INTRAMUSCULAR | Status: AC | PRN
  Administered 2019-08-04: 15:00:00 75 mL via INTRAVENOUS

## 2019-08-04 MED ORDER — HYDROCODONE-ACETAMINOPHEN 10-300 MG PO TABS: 1.0000 | ORAL_TABLET | Freq: Three times a day (TID) | ORAL | 0 refills | Status: DC | PRN

## 2019-08-04 MED ORDER — POLYETHYLENE GLYCOL 3350 17 G PO PACK: 17.0000 g | PACK | Freq: Every day | ORAL | 0 refills | Status: DC | PRN

## 2019-08-04 MED ORDER — DOCUSATE SODIUM 100 MG PO CAPS: 100.0000 mg | ORAL_CAPSULE | Freq: Two times a day (BID) | ORAL | 1 refills | Status: DC

## 2019-08-04 MED ORDER — ENSURE ENLIVE PO LIQD
237.0000 mL | Freq: Two times a day (BID) | ORAL | Status: DC
  Administered 2019-08-04: 14:00:00 237 mL via ORAL

## 2019-08-04 NOTE — TOC Progression Note (Signed)
Pt is Observation status and has [employer] medical benefits. Notified the [facility] of pt's current status. Reference ID# is [id number].

## 2019-08-04 NOTE — Discharge Summary (Signed)
Physician Discharge Summary  Avante Carneiro UYQ:[id number] DOB: [birth year] DOA: 08/03/2019  PCP: [facility]  Admit date: 08/03/2019 Discharge date: 08/04/2019  Time spent: 35 minutes  Recommendations for Outpatient Follow-up:  1. Repeat basic metabolic panel to follow electrolytes and renal function 2. Reassess blood pressure and adjust antihypertensive regimen 3. Outpatient follow-up with oncology for further evaluation and management.   Discharge Diagnoses:  Principal Problem:   Metastatic cancer ([place]) Active Problems:   COPD (chronic obstructive pulmonary disease) ([place])   Hypertension   Diabetes mellitus without complication (HCC)   Chronic respiratory failure with hypoxia (HCC) Metabolic encephalopathy  Discharge Condition: Stable and improved.  Discharged home with instructions to follow-up with PCP and oncology service as an outpatient.  Diet recommendation: Modified carbohydrate diet.  Filed Weights   08/03/19 1056  Weight: 83.9 kg    History of present illness:  As per H&P written by Dr. Jamse Arn on 08/03/2019 73 y.o. male with past medical history significant for hypertension diabetes and interstitial lung disease who was in his usual state of health until last month when an incidental CT done at the [facility] found a lung mass.  Patient was supposed to undergo further work-up however his son moved him from [place] to [place] to live closer by to him so the work-up was never done.  Over the past couple of days patient has had increasing pain in his substernal area as well as pain in his right upper quadrant.  According to his son patient does not like to take pain medications and had resisted coming to the hospital.  He has had significantly decreased p.o. intake over the past couple of days and he has not eaten anything or had anything to drink today.  Today apparently the pain was so bad that the patient requested an ambulance be  called.  IHistory is entirely per patient's son Delfino Lovett as patient was unable to provide history.  ED Course:  In the ED patient was initially treated with fentanyl 100 with modest improvement in pain.  He underwent chest abdomen and pelvic CT which showed widely metastatic disease in chest and abdomen with large mets in his liver which would be consistent with his right upper quadrant pain.  And was to discharge patient home for outpatient follow-up however patient received 1 mg of Dilaudid prior to going home and was found to be very sleepy and unable to go home safely.  Because he was in so much pain it was felt that Narcan would not be appropriate at this time so tried hospitalist were called to admit patient for observation.  Hospital Course:  1-altered mental status -Appears to be related to medication (narcotics given for pain) -Mentation improved and back to baseline at time of discharge. -No signs of active infection appreciated. -Pain medications has been adjusted to minimize the chances of future mental improvement. -Will need outpatient MRI.  Family member severe case of claustrophobia and would like to pursuit open MRI evaluation as recommended by [place] oncologist.  2-metastatic cancer -With concerns for spread lung cancer versus esophageal cancer given posterior mediastinal in location. -Outpatient follow-up with oncology service to complete work-up, staging and biopsy. -As discussed with family members at bedside will use oral Vicodin for assistance with pain management.  3-leukocytosis -Most likely secondary to malignancy -No real evidence of acute infection at this time -Patient denies dysuria, worsening shortness of breath or cough -He is also afebrile -We will hold on antibiotic therapy currently. -Advised to maintain  adequate hydration.  4-type 2 diabetes mellitus (insulin-dependent) -Resume home hypoglycemic regimen -Patient advised to follow modified carbohydrate  diet.  5-essential hypertension -Continue home atenolol.  6-chronic respiratory failure in the setting of interstitial lung disease and COPD -Continue home steroids regimen -Continue home oxygen supplementation -Continue inhalers/nebulizer management. -No worsening in his breathing and currently no wheezing.  7-GERD -continue PPI  8-hyperlipidemia -Continue Zocor.  Procedures:  See below for x-ray reports.   Consultations:  Oncology Service (will follow patient after discharge)  Discharge Exam: Vitals:   08/04/19 0507 08/04/19 0853  BP: 99/71 124/86  Pulse: (!) 107 (!) 105  Resp: 20   Temp: 98.4 F (36.9 C)   SpO2: 98%     General: Alert, awake and oriented x3; reports no having significant chest discomfort at this time and expressed a stable breathing.  No nausea, no vomiting. Cardiovascular: Sinus rhythm, no rubs, no gallops, no JVD. Respiratory: Fair air movement bilaterally, positive scattered rhonchi, no active wheezing.  Patient with good oxygen saturation on his chronic 5 L nasal cannula supplementation. Abdomen: Soft, nontender, positive bowel sounds. Extremities: No cyanosis, no clubbing.  Discharge Instructions   Discharge Instructions    Diet - low sodium heart healthy   Complete by: As directed    Discharge instructions   Complete by: As directed    Maintain adequate hydration Take medications as prescribed Follow-up with oncology service at earliest convenience. Follow-up with PCP in 2 weeks.     Allergies as of 08/04/2019      Reactions   Morphine And Related       Medication List    TAKE these medications   aspirin EC 81 MG tablet Take 81 mg by mouth daily.   atenolol 25 MG tablet Commonly known as: TENORMIN Take 12.5 mg by mouth daily.   docusate sodium 100 MG capsule Commonly known as: Colace Take 1 capsule (100 mg total) by mouth 2 (two) times daily.   HYDROcodone-Acetaminophen 10-300 MG Tabs Take 1 tablet by mouth every 8  (eight) hours as needed for up to 14 days (severe pain).   insulin NPH-regular Human (70-30) 100 UNIT/ML injection Inject 8 Units into the skin 2 (two) times daily with a meal.   multivitamin tablet Take 1 tablet by mouth daily.   omeprazole 40 MG capsule Commonly known as: PRILOSEC Take 40 mg by mouth daily.   polyethylene glycol 17 g packet Commonly known as: MiraLax Take 17 g by mouth daily as needed.   predniSONE 5 MG tablet Commonly known as: DELTASONE Take 5 mg by mouth daily with breakfast.   simvastatin 10 MG tablet Commonly known as: ZOCOR Take 10 mg by mouth at bedtime.      Allergies  Allergen Reactions  . Morphine And Related    Follow-up Information    Derek Jack, MD.   Specialty: Hematology Contact information: [place] [address] [phone]        [facility]. Schedule an appointment as soon as possible for a visit in 2 week(s).   Contact information: [place] [address] [phone]           The results of significant diagnostics from this hospitalization (including imaging, microbiology, ancillary and laboratory) are listed below for reference.    Significant Diagnostic Studies: Ct Angio Chest Pe W And/or Wo Contrast  Result Date: 08/03/2019 CLINICAL DATA:  Right-sided chest pain. EXAM: CT ANGIOGRAPHY CHEST WITH CONTRAST TECHNIQUE: Multidetector CT imaging of  the chest was performed using the standard protocol during bolus administration of intravenous contrast. Multiplanar CT image reconstructions and MIPs were obtained to evaluate the vascular anatomy. CONTRAST:  132mL OMNIPAQUE IOHEXOL 350 MG/ML SOLN COMPARISON:  Chest x-ray dated 08/03/2019 FINDINGS: Cardiovascular: There are no pulmonary emboli. However, there is a posterior mediastinal mass at the level of the left atrium which invades the left atrium. There is a 3 x 2 x 2 cm irregular mass in the posterior  aspect of the left atrium representing a direct extension of the tumor in the posterior mediastinum into the atrium. Overall heart size is normal. No pericardial effusion. Slight coronary artery calcifications. Mediastinum/Nodes: There is right hilar and subcarinal adenopathy. There is a 6.6 x 6.6 x 4.4 cm posterior mediastinal mass immediately posterior to the left atrium extending to the diaphragmatic hiatus immediately adjacent to the esophagus. The fat planes between the distal esophagus and this mass are obscured. The thyroid gland and trachea appear normal. Lungs/Pleura: The mass in the posterior mediastinum extends into the left hilum. There is narrowing of the right middle lobe and lower lobe bronchi. There is peripheral interstitial disease throughout both lungs, most prominent posteriorly at the right lung base. No discrete effusions. Upper Abdomen: There is adenopathy around the porta hepatis and around the inferior vena cava just below the liver. Musculoskeletal: There is a slight anterior wedge deformity of there is also a 9 mm lytic lesion in the superior aspect of T7, likely a benign Schmorl's node. PET scan does show metastatic disease in the sclerotic left pedicle of T7. There is a destructive expansile lesion of the anterior aspect of the right third rib. The PET scan demonstrated numerous metastatic lesions in the ribs and spine as well as in the scapulae. There is a subtle lytic lesion in the posteromedial aspect of the left ninth rib. Review of the MIP images confirms the above findings. IMPRESSION: 1. No pulmonary emboli. 2. Large mass in the posterior mediastinum extending into the left hilum with invasion of the left atrium. 3. Right hilar and subcarinal adenopathy consistent with metastatic disease. 4. Metastatic disease to the bones as described. 5. Probable benign Schmorl's node in the superior aspect of the T7 vertebral body. Electronically Signed   By: Lorriane Shire M.D.   On:  08/03/2019 15:37   Ct Abdomen Pelvis W Contrast  Result Date: 08/03/2019 CLINICAL DATA:  Acute generalized abdominal pain. Foot EXAM: CT ABDOMEN AND PELVIS WITH CONTRAST TECHNIQUE: Multidetector CT imaging of the abdomen and pelvis was performed using the standard protocol following bolus administration of intravenous contrast. CONTRAST:  151mL OMNIPAQUE IOHEXOL 350 MG/ML SOLN COMPARISON:  None. FINDINGS: Lower chest: Posterior mediastinal mass invading the left atrium. Adenopathy adjacent to the distal esophagus just above the diaphragmatic hiatus. Hepatobiliary: There are 3 discrete metastases in the liver which correlate with the PET-CT scan. There is a 2 cm mass in the posterior aspect of the dome of the right lobe of the liver. There is a 17 mm lesion in the periphery of the right lobe and there is a 16 mm lesion in the inferior aspect of the left lobe of the liver. There is a single calcified gallstone. No dilated bile ducts. Prominent caudate lobe. Pancreas: Normal. Spleen: Normal. 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 Left adrenal gland appears normal. Bilateral renal cysts as demonstrated on the prior PET-CT scan. No hydronephrosis. Bladder appears normal. Stomach/Bowel: Stomach is within normal limits. Appendix appears normal. No evidence of bowel  wall thickening, distention, or inflammatory changes. Multiple diverticula in the distal colon. The appendix is not discretely identified. Vascular/Lymphatic: Aortic atherosclerosis. Multiple enlarged lymph nodes in the porta hepatis and adjacent to the celiac axis. Reproductive: Prostate is unremarkable. Other: No abdominal wall hernia or abnormality. No abdominopelvic ascites. Musculoskeletal: The multiple metastatic lesions in the spine, pelvic bones, and proximal left femur are quite subtle on the CT scan. While the proximal left femur lesions has a vague area of increased density best seen on image 102 of series 5. No discrete  destructive bone lesions. IMPRESSION: 1. 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 2. Multiple enlarged lymph nodes in the porta hepatis and adjacent to the celiac axis. 3. Multiple metastatic lesions in the spine, pelvic bones, and proximal left femur. 4. No acute abnormalities in the abdomen as compared to the prior PET-CT. Aortic Atherosclerosis (ICD10-I70.0). Electronically Signed   By: Lorriane Shire M.D.   On: 08/03/2019 15:47   Nm Pet Image Initial (pi) Skull Base To Thigh  Result Date: 08/01/2019 CLINICAL DATA:  Initial treatment strategy for right lower lobe lung malignancy. EXAM: NUCLEAR MEDICINE PET SKULL BASE TO THIGH TECHNIQUE: 12.2 mCi F-18 FDG was injected intravenously. Full-ring PET imaging was performed from the skull base to thigh after the radiotracer. CT data was obtained and used for attenuation correction and anatomic localization. Fasting blood glucose: 110 mg/dl COMPARISON:  None. FINDINGS: Mediastinal blood pool activity: SUV max 3.3 Liver activity: SUV max NA NECK: Hypermetabolic mildly enlarged bilateral level II neck lymph nodes, for example measuring 1.0 cm on the left with max SUV 4.8 (series 3/image 36). Small focus of hypermetabolism in the right submandibular musculature with max SUV 7.0, without discrete CT correlate. Incidental CT findings: none CHEST: Hypermetabolic solid 5.7 x 4.8 cm lung mass in the paramediastinal right lower lobe with probable direct mediastinal invasion with max SUV 16.4 (series 3/image 88). There is patchy irregular thickening of peribronchovascular interstitium throughout both lungs without additional foci of pulmonary hypermetabolism. Hypermetabolic 0.7 cm right axillary node with max SUV 3.9 (series 3/image 58). Hypermetabolic enlarged 1.3 cm subcarinal node with max SUV 7.7 (series 3/image 75). No hypermetabolic left axillary or hilar nodes. Hypermetabolic 1.1 cm soft tissue nodule posterior to the right pectoralis  musculature with max SUV 5.3 (series 3/image 95). Incidental CT findings: Coronary atherosclerosis. Mildly atherosclerotic nonaneurysmal thoracic aorta. ABDOMEN/PELVIS: Three hypermetabolic liver lesions scattered in the liver, including a 1.3 cm posterior right liver lesion with max SUV 9.4 (series 3/image 100), a posterior right liver dome 1.3 cm lesion with max SUV 7.6 (series 3/image 87) and a 1.1 cm segment 3 left liver lobe lesion with max SUV 7.7 (series 3/image 116). Hypermetabolic 2.4 cm right adrenal nodule with max SUV 15.7 (series 3/image 117). No hypermetabolic left adrenal nodules. Two hypermetabolic soft tissue implants in the omentum, largest 1.4 cm in the right omentum with max SUV 8.4 (series 3/image 159). Hypermetabolic high right retroperitoneal 1.2 cm lymph node with max SUV 4.9 (series 3/image 117). No additional hypermetabolic abdominopelvic nodes. Bilateral medial gluteal musculature soft tissue foci of hypermetabolism, largest 1.1 cm on the left with max SUV 7.4 (series 3/image 253). No abnormal hypermetabolic activity within the pancreas or spleen. Incidental CT findings: Cholelithiasis. Atherosclerotic nonaneurysmal abdominal aorta. Punctate nonobstructing interpolar right renal stone. Mild sigmoid diverticulosis. SKELETON: Numerous hypermetabolic osseous lesions scattered throughout the axial and proximal appendicular skeleton, which are relatively CT occult. Representative lesions as follows: -inferior right scapular lesion  with max SUV 11.6 -anterior left third rib lesion with max SUV 13.5 -proximal right humeral metaphysis lesion with max SUV 10.6 -right iliac crest lesion with max SUV 8.6 -left femoral neck lesion with max SUV 9.2 -L1 vertebral lesion with max SUV 13.4 Incidental CT findings: none IMPRESSION: 1. Intensely hypermetabolic (max SUV 33.2) 5.7 cm lung mass in the paramediastinal right lower lobe, compatible with primary bronchogenic carcinoma, with probable direct  mediastinal invasion. 2. Hypermetabolic subcarinal lymphadenopathy. 3. Irregular patchy thickening of the peribronchovascular interstitium throughout both lungs, nonspecific, lymphangitic carcinomatosis not excluded. 4. Hypermetabolic distant lymphadenopathy in the bilateral level II neck, right axilla and high right retroperitoneum. 5. Multiple hypermetabolic liver metastases. 6. Hypermetabolic right adrenal metastasis. 7. Hypermetabolic omental metastases. 8. Scattered hypermetabolic soft tissue metastases in right chest wall and bilateral gluteal musculature. 9. Widespread hypermetabolic osseous metastatic disease throughout the axial and proximal appendicular skeleton. Hypermetabolic left femoral neck metastasis places the patient at risk for pathologic fracture. 10. PET/CT staging is stage IVB (T4 N2 M1c). 11. Chronic findings include: Aortic Atherosclerosis (ICD10-I70.0). Coronary atherosclerosis. Cholelithiasis. Mild sigmoid diverticulosis. Electronically Signed   By: Ilona Sorrel M.D.   On: 08/01/2019 22:18   Dg Chest Portable 1 View  Result Date: 08/03/2019 CLINICAL DATA:  Chest pain. EXAM: PORTABLE CHEST 1 VIEW COMPARISON:  None. FINDINGS: Mild cardiomegaly is noted. Hypoinflation of the lungs is noted. No pneumothorax is noted. Small pleural effusions may be present. Bilateral lung opacities are noted concerning for possible edema or inflammation. Bony thorax is unremarkable. IMPRESSION: Hypoinflation of the lungs is noted. Small pleural effusions may be present. Bilateral interstitial lung opacities are noted which may represent edema or inflammation. Electronically Signed   By: Marijo Conception M.D.   On: 08/03/2019 11:40    Microbiology: Recent Results (from the past 240 hour(s))  SARS Coronavirus 2 [facility] order, Performed in [facility] hospital lab) Nasopharyngeal Nasopharyngeal Swab     Status: None   Collection Time: 08/03/19 11:13 AM   Specimen: Nasopharyngeal Swab  Result Value Ref  Range Status   SARS Coronavirus 2 NEGATIVE NEGATIVE Final    Comment: (NOTE) If result is NEGATIVE SARS-CoV-2 target nucleic acids are NOT DETECTED. The SARS-CoV-2 RNA is generally detectable in upper and lower  respiratory specimens during the acute phase of infection. The lowest  concentration of SARS-CoV-2 viral copies this assay can detect is 250  copies / mL. A negative result does not preclude SARS-CoV-2 infection  and should not be used as the sole basis for treatment or other  patient management decisions.  A negative result may occur with  improper specimen collection / handling, submission of specimen other  than nasopharyngeal swab, presence of viral mutation(s) within the  areas targeted by this assay, and inadequate number of viral copies  (<250 copies / mL). A negative result must be combined with clinical  observations, patient history, and epidemiological information. If result is POSITIVE SARS-CoV-2 target nucleic acids are DETECTED. The SARS-CoV-2 RNA is generally detectable in upper and lower  respiratory specimens dur ing the acute phase of infection.  Positive  results are indicative of active infection with SARS-CoV-2.  Clinical  correlation with patient history and other diagnostic information is  necessary to determine patient infection status.  Positive results do  not rule out bacterial infection or co-infection with other viruses. If result is PRESUMPTIVE POSTIVE SARS-CoV-2 nucleic acids MAY BE PRESENT.   A presumptive positive result was obtained on the submitted specimen  and  confirmed on repeat testing.  While 2019 novel coronavirus  (SARS-CoV-2) nucleic acids may be present in the submitted sample  additional confirmatory testing may be necessary for epidemiological  and / or clinical management purposes  to differentiate between  SARS-CoV-2 and other Sarbecovirus currently known to infect humans.  If clinically indicated additional testing with an  alternate test  methodology [phone]) is advised. The SARS-CoV-2 RNA is generally  detectable in upper and lower respiratory sp ecimens during the acute  phase of infection. The expected result is Negative. Fact Sheet for Patients:  StrictlyIdeas.no Fact Sheet for Healthcare Providers: BankingDealers.co.za This test is not yet approved or cleared by the [place] FDA and has been authorized for detection and/or diagnosis of SARS-CoV-2 by FDA under an Emergency Use Authorization (EUA).  This EUA will remain in effect (meaning this test can be used) for the duration of the COVID-19 declaration under Section 564(b)(1) of the Act, 21 U.S.C. section 360bbb-3(b)(1), unless the authorization is terminated or revoked sooner. Performed at [facility], [address] [place], [place] [address]   Culture, blood (routine x 2)     Status: None (Preliminary result)   Collection Time: 08/03/19 11:45 AM   Specimen: Right Antecubital; Blood  Result Value Ref Range Status   Specimen Description RIGHT ANTECUBITAL  Final   Special Requests   Final    BOTTLES DRAWN AEROBIC AND ANAEROBIC Blood Culture adequate volume   Culture   Final    NO GROWTH < 24 HOURS Performed at [facility], [address], [place], [place] [address]    Report Status PENDING  Incomplete  Culture, blood (routine x 2)     Status: None (Preliminary result)   Collection Time: 08/03/19 11:54 AM   Specimen: BLOOD LEFT FOREARM  Result Value Ref Range Status   Specimen Description BLOOD LEFT FOREARM  Final   Special Requests   Final    BOTTLES DRAWN AEROBIC AND ANAEROBIC Blood Culture adequate volume   Culture   Final    NO GROWTH < 24 HOURS Performed at [facility], [address], [place], [place] [address]    Report Status PENDING  Incomplete     Labs: Basic Metabolic Panel: Recent Labs  Lab 08/03/19 1111 08/04/19 0443  NA 136 137  K 4.1 4.5  CL 94* 98  CO2 29 28   GLUCOSE 111* 110*  BUN 10 12  CREATININE 1.01 0.95  CALCIUM 9.2 8.8*   Liver Function Tests: Recent Labs  Lab 08/03/19 1111  AST 14*  ALT 14  ALKPHOS 68  BILITOT 1.4*  PROT 7.5  ALBUMIN 3.2*   Recent Labs  Lab 08/03/19 1111  LIPASE 20   CBC: Recent Labs  Lab 08/03/19 1111 08/04/19 0443  WBC 19.9* 22.3*  NEUTROABS 13.9*  --   HGB 14.2 13.0  HCT 46.8 42.5  MCV 90.5 91.0  PLT 323 300    CBG: Recent Labs  Lab 08/03/19 2050 08/04/19 0722 08/04/19 1110  GLUCAP 97 86 144*    Signed:  Barton Dubois MD.  [facility] 08/04/2019, 12:36 PM

## 2019-08-04 NOTE — Consult Note (Signed)
[facility] Consultation Oncology  Name: Andrew Roman      MRN: [id number]    Location: A302/A302-01  Date: 08/04/2019 Time:1:48 PM   REFERRING PHYSICIAN: Dr. Roderic Palau.  REASON FOR CONSULT: Metastatic cancer.   DIAGNOSIS: Metastatic cancer under work-up.  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  Patient was confused this morning when I saw him.  However he is more clear at noon time.  His son was present at bedside.  He was found to have a lung mass by his lung doctor at the [place] in [place].  A PET CT scan was ordered on 08/01/2019 which showed intensely hypermetabolic 5.7 cm lung mass in the paramediastinal right lower lobe, with probable direct mediastinal invasion, hypermetabolic subcarinal lymphadenopathy, irregular patchy thickening of the peribronchial vascular interstitium throughout both lungs, nonspecific, hypermetabolic lymphadenopathy in bilateral level 2 nodes, right axilla and high right retroperitoneum.  Multiple hypermetabolic liver masses.  Hypermetabolic right adrenal metastasis and omental metastasis.  Scattered soft tissue metastasis in the right chest wall and bilateral gluteal musculature.  Widespread bone metastasis.  Patient was feeling weak and was having pains in the chest and right upper quadrant for the last 3 to 4 days.  He came to the ER on 08/03/2019 and a CT AP was done which showed multiple metastatic lesions in the liver and right adrenal gland, multiple enlarged lymph nodes in the porta hepatis metastatic lesions in the spine, pelvic bones, proximal left femur.  CT chest PE protocol did not show pulmonary embolus.  There is a posterior mediastinal mass at the level of the left atrium which invades the left atrium.  There is a 3 x 2 x 2 cm irregular mass in the posterior aspect of the left atrium  representing a direct extension of the tumor in the posterior mediastinum into the atrium.  Right hilar and subcarinal adenopathy, 6.6 x 6.6 x 4.4 cm posterior mediastinal mass immediately posterior to the left atrium, extending to the diaphragmatic hiatus immediately adjacent to the esophagus.  Fat planes between the distal esophagus and this mass are obscured. Patient did not report any significant weight loss.  However son reports that he had lost some weight.  Patient denies any dysphagia or odynophagia.  He reported chest pain.  He did receive Dilaudid 1 mg in the ER last night and became very sleepy.  Denies any cough or hemoptysis.  Denies any headaches or vision changes.  Patient smoked 3 packs/day up to age 70.  He started smoking around 16.  He used to live by himself in [place].  He moved to [place] in May of this year and lives in an apartment.  He is independent of his activities of daily living.  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   PAST MEDICAL HISTORY:   Past Medical History:  Diagnosis Date  . Cancer (HCC)    lung mass  . COPD (chronic obstructive pulmonary disease) (HCC)    lung disease  . Diabetes mellitus without complication ([place])   . Hypertension     ALLERGIES: Allergies  Allergen Reactions  . Morphine And Related       MEDICATIONS: I have reviewed the patient's current medications.     PAST SURGICAL HISTORY History reviewed. No pertinent surgical history.  FAMILY HISTORY: History reviewed. No pertinent family history.  SOCIAL HISTORY:  reports that he has never smoked. He has never used smokeless tobacco. He reports that he does not drink alcohol or use drugs.  PERFORMANCE STATUS: The patient's performance status is 2 - Symptomatic, <50% confined to bed  PHYSICAL EXAM: Most Recent Vital Signs: Blood pressure 124/86, pulse (!) 105, temperature 98.4 F (36.9 C), temperature source Oral, resp. rate 20, height 5\' 7"  (1.702 m),  weight 185 lb (83.9 kg), SpO2 98 %. BP 124/86   Pulse (!) 105   Temp 98.4 F (36.9 C) (Oral)   Resp 20   Ht 5\' 7"  (1.702 m)   Wt 185 lb (83.9 kg)   SpO2 98%   BMI 28.98 kg/m  General appearance: alert, cooperative and appears stated age Neck: no adenopathy and supple, symmetrical, trachea midline Lungs: clear to auscultation bilaterally Heart: regular rate and rhythm Abdomen: soft, non-tender; bowel sounds normal; no masses,  no organomegaly Extremities: extremities normal, atraumatic, no cyanosis or edema Skin: Skin color, texture, turgor normal. No rashes or lesions Lymph nodes: Cervical, supraclavicular, and axillary nodes normal. Neurologic: Grossly normal  LABORATORY DATA:  Results for orders placed or performed during the hospital encounter of 08/03/19 (from the past 48 hour(s))  Comprehensive metabolic panel     Status: Abnormal   Collection Time: 08/03/19 11:11 AM  Result Value Ref Range   Sodium 136 135 - 145 mmol/L   Potassium 4.1 3.5 - 5.1 mmol/L   Chloride 94 (L) 98 - 111 mmol/L   CO2 29 22 - 32 mmol/L   Glucose, Bld 111 (H) 70 - 99 mg/dL   BUN 10 8 - 23 mg/dL   Creatinine, Ser 1.01 0.61 - 1.24 mg/dL   Calcium 9.2 8.9 - 10.3 mg/dL   Total Protein 7.5 6.5 - 8.1 g/dL   Albumin 3.2 (L) 3.5 - 5.0 g/dL   AST 14 (L) 15 - 41 U/L   ALT 14 0 - 44 U/L   Alkaline Phosphatase 68 38 - 126 U/L   Total Bilirubin 1.4 (H) 0.3 - 1.2 mg/dL   GFR calc non Af Amer >60 >60 mL/min   GFR calc Af Amer >60 >60 mL/min   Anion gap 13 5 - 15    Comment: Performed at [facility], [address], [place], [place] [address]  Lipase, blood     Status: None   Collection Time: 08/03/19 11:11 AM  Result Value Ref Range   Lipase 20 11 - 51 U/L    Comment: Performed at [facility], [address] [place], [place] [address]  Troponin I (High Sensitivity)     Status: Abnormal   Collection Time: 08/03/19 11:11 AM  Result Value Ref Range   Troponin I (High Sensitivity) 26 (H) <18 ng/L     Comment: (NOTE) Elevated high sensitivity troponin I (hsTnI) values and significant  changes across serial measurements may suggest ACS but many other  chronic and acute conditions are known to elevate hsTnI results.  Refer to the "Links" section for chest pain algorithms and additional  guidance. Performed at [facility], [address], [place], [place] [address]   CBC with Differential     Status: Abnormal   Collection Time: 08/03/19 11:11 AM  Result Value Ref Range   WBC 19.9 (H) 4.0 - 10.5 K/uL   RBC 5.17 4.22 - 5.81 MIL/uL   Hemoglobin 14.2 13.0 - 17.0 g/dL   HCT 46.8 39.0 - 52.0 %   MCV 90.5 80.0 - 100.0 fL   MCH 27.5 26.0 -  34.0 pg   MCHC 30.3 30.0 - 36.0 g/dL   RDW 14.1 11.5 - 15.5 %   Platelets 323 150 - 400 K/uL   nRBC 0.0 0.0 - 0.2 %   Neutrophils Relative % 69 %   Neutro Abs 13.9 (H) 1.7 - 7.7 K/uL   Lymphocytes Relative 11 %   Lymphs Abs 2.1 0.7 - 4.0 K/uL   Monocytes Relative 10 %   Monocytes Absolute 2.1 (H) 0.1 - 1.0 K/uL   Eosinophils Relative 8 %   Eosinophils Absolute 1.5 (H) 0.0 - 0.5 K/uL   Basophils Relative 1 %   Basophils Absolute 0.1 0.0 - 0.1 K/uL   Immature Granulocytes 1 %   Abs Immature Granulocytes 0.25 (H) 0.00 - 0.07 K/uL    Comment: Performed at [facility], [address], [place], [place] [address]  Protime-INR     Status: None   Collection Time: 08/03/19 11:11 AM  Result Value Ref Range   Prothrombin Time 15.0 11.4 - 15.2 seconds   INR 1.2 0.8 - 1.2    Comment: (NOTE) INR goal varies based on device and disease states. Performed at [facility], [address] [place], [place] [address]   Urinalysis, Routine w reflex microscopic     Status: Abnormal   Collection Time: 08/03/19 11:12 AM  Result Value Ref Range   Color, Urine YELLOW YELLOW   APPearance CLEAR CLEAR   Specific Gravity, Urine 1.013 1.005 - 1.030   pH 6.0 5.0 - 8.0   Glucose, UA NEGATIVE NEGATIVE mg/dL   Hgb urine dipstick NEGATIVE NEGATIVE   Bilirubin Urine NEGATIVE  NEGATIVE   Ketones, ur 20 (A) NEGATIVE mg/dL   Protein, ur NEGATIVE NEGATIVE mg/dL   Nitrite POSITIVE (A) NEGATIVE   Leukocytes,Ua NEGATIVE NEGATIVE   RBC / HPF 6-10 0 - 5 RBC/hpf   WBC, UA 6-10 0 - 5 WBC/hpf   Bacteria, UA NONE SEEN NONE SEEN    Comment: Performed at [facility], [address] [place], [place] [address]  Urine culture     Status: None   Collection Time: 08/03/19 11:12 AM   Specimen: Urine, Clean Catch  Result Value Ref Range   Specimen Description      URINE, CLEAN CATCH Performed at [facility], [address], [place], [place] [address]    Special Requests      Normal Performed at [facility], [address], [place], [place] [address]    Culture      Multiple bacterial morphotypes present, none predominant. Suggest appropriate recollection if clinically indicated.   Report Status 08/04/2019 FINAL   SARS Coronavirus 2 [facility] order, Performed in [facility] hospital lab) Nasopharyngeal Nasopharyngeal Swab     Status: None   Collection Time: 08/03/19 11:13 AM   Specimen: Nasopharyngeal Swab  Result Value Ref Range   SARS Coronavirus 2 NEGATIVE NEGATIVE    Comment: (NOTE) If result is NEGATIVE SARS-CoV-2 target nucleic acids are NOT DETECTED. The SARS-CoV-2 RNA is generally detectable in upper and lower  respiratory specimens during the acute phase of infection. The lowest  concentration of SARS-CoV-2 viral copies this assay can detect is 250  copies / mL. A negative result does not preclude SARS-CoV-2 infection  and should not be used as the sole basis for treatment or other  patient management decisions.  A negative result may occur with  improper specimen collection / handling, submission of specimen other  than nasopharyngeal swab, presence of viral mutation(s) within the  areas  targeted by this assay, and inadequate number of viral copies  (<250 copies / mL). A negative result must be combined with clinical  observations, patient history, and  epidemiological information. If result is POSITIVE SARS-CoV-2 target nucleic acids are DETECTED. The SARS-CoV-2 RNA is generally detectable in upper and lower  respiratory specimens dur ing the acute phase of infection.  Positive  results are indicative of active infection with SARS-CoV-2.  Clinical  correlation with patient history and other diagnostic information is  necessary to determine patient infection status.  Positive results do  not rule out bacterial infection or co-infection with other viruses. If result is PRESUMPTIVE POSTIVE SARS-CoV-2 nucleic acids MAY BE PRESENT.   A presumptive positive result was obtained on the submitted specimen  and confirmed on repeat testing.  While 2019 novel coronavirus  (SARS-CoV-2) nucleic acids may be present in the submitted sample  additional confirmatory testing may be necessary for epidemiological  and / or clinical management purposes  to differentiate between  SARS-CoV-2 and other Sarbecovirus currently known to infect humans.  If clinically indicated additional testing with an alternate test  methodology [id number]) is advised. The SARS-CoV-2 RNA is generally  detectable in upper and lower respiratory sp ecimens during the acute  phase of infection. The expected result is Negative. Fact Sheet for Patients:  StrictlyIdeas.no Fact Sheet for Healthcare Providers: BankingDealers.co.za This test is not yet approved or cleared by the [place] FDA and has been authorized for detection and/or diagnosis of SARS-CoV-2 by FDA under an Emergency Use Authorization (EUA).  This EUA will remain in effect (meaning this test can be used) for the duration of the COVID-19 declaration under Section 564(b)(1) of the Act, 21 U.S.C. section 360bbb-3(b)(1), unless the authorization is terminated or revoked sooner. Performed at [facility], [address], [place], [place] [address]   Lactic acid, plasma      Status: None   Collection Time: 08/03/19 11:45 AM  Result Value Ref Range   Lactic Acid, Venous 1.4 0.5 - 1.9 mmol/L    Comment: Performed at [facility], [address] [place], [place] [address]  Culture, blood (routine x 2)     Status: None (Preliminary result)   Collection Time: 08/03/19 11:45 AM   Specimen: Right Antecubital; Blood  Result Value Ref Range   Specimen Description RIGHT ANTECUBITAL    Special Requests      BOTTLES DRAWN AEROBIC AND ANAEROBIC Blood Culture adequate volume   Culture      NO GROWTH < 24 HOURS Performed at [facility], [address] [place], [place] [address]    Report Status PENDING   Culture, blood (routine x 2)     Status: None (Preliminary result)   Collection Time: 08/03/19 11:54 AM   Specimen: BLOOD LEFT FOREARM  Result Value Ref Range   Specimen Description BLOOD LEFT FOREARM    Special Requests      BOTTLES DRAWN AEROBIC AND ANAEROBIC Blood Culture adequate volume   Culture      NO GROWTH < 24 HOURS Performed at [facility], [address], [place], [place] [address]    Report Status PENDING   Lactic acid, plasma     Status: None   Collection Time: 08/03/19  2:01 PM  Result Value Ref Range   Lactic Acid, Venous 1.6 0.5 - 1.9 mmol/L    Comment: Performed at [facility], [address], [place], [place] [address]  Troponin I (High Sensitivity)     Status: Abnormal  Collection Time: 08/03/19  2:01 PM  Result Value Ref Range   Troponin I (High Sensitivity) 26 (H) <18 ng/L    Comment: (NOTE) Elevated high sensitivity troponin I (hsTnI) values and significant  changes across serial measurements may suggest ACS but many other  chronic and acute conditions are known to elevate hsTnI results.  Refer to the "Links" section for chest pain algorithms and additional  guidance. Performed at [facility], [address], [place], [place] [address]   Glucose, capillary     Status: None   Collection Time: 08/03/19  8:50 PM  Result Value Ref  Range   Glucose-Capillary 97 70 - 99 mg/dL  Basic metabolic panel     Status: Abnormal   Collection Time: 08/04/19  4:43 AM  Result Value Ref Range   Sodium 137 135 - 145 mmol/L   Potassium 4.5 3.5 - 5.1 mmol/L   Chloride 98 98 - 111 mmol/L   CO2 28 22 - 32 mmol/L   Glucose, Bld 110 (H) 70 - 99 mg/dL   BUN 12 8 - 23 mg/dL   Creatinine, Ser 0.95 0.61 - 1.24 mg/dL   Calcium 8.8 (L) 8.9 - 10.3 mg/dL   GFR calc non Af Amer >60 >60 mL/min   GFR calc Af Amer >60 >60 mL/min   Anion gap 11 5 - 15    Comment: Performed at [facility], [address], [place], [place] [address]  CBC     Status: Abnormal   Collection Time: 08/04/19  4:43 AM  Result Value Ref Range   WBC 22.3 (H) 4.0 - 10.5 K/uL   RBC 4.67 4.22 - 5.81 MIL/uL   Hemoglobin 13.0 13.0 - 17.0 g/dL   HCT 42.5 39.0 - 52.0 %   MCV 91.0 80.0 - 100.0 fL   MCH 27.8 26.0 - 34.0 pg   MCHC 30.6 30.0 - 36.0 g/dL   RDW 13.7 11.5 - 15.5 %   Platelets 300 150 - 400 K/uL   nRBC 0.0 0.0 - 0.2 %    Comment: Performed at [facility], [address], [place], [place] [address]  Glucose, capillary     Status: None   Collection Time: 08/04/19  7:22 AM  Result Value Ref Range   Glucose-Capillary 86 70 - 99 mg/dL  Glucose, capillary     Status: Abnormal   Collection Time: 08/04/19 11:10 AM  Result Value Ref Range   Glucose-Capillary 144 (H) 70 - 99 mg/dL      RADIOGRAPHY: Ct Angio Chest Pe W And/or Wo Contrast  Result Date: 08/03/2019 CLINICAL DATA:  Right-sided chest pain. EXAM: CT ANGIOGRAPHY CHEST WITH CONTRAST TECHNIQUE: Multidetector CT imaging of the chest was performed using the standard protocol during bolus administration of intravenous contrast. Multiplanar CT image reconstructions and MIPs were obtained to evaluate the vascular anatomy. CONTRAST:  13mL OMNIPAQUE IOHEXOL 350 MG/ML SOLN COMPARISON:  Chest x-ray dated 08/03/2019 FINDINGS: Cardiovascular: There are no pulmonary emboli. However, there is a posterior mediastinal mass at  the level of the left atrium which invades the left atrium. There is a 3 x 2 x 2 cm irregular mass in the posterior aspect of the left atrium representing a direct extension of the tumor in the posterior mediastinum into the atrium. Overall heart size is normal. No pericardial effusion. Slight coronary artery calcifications. Mediastinum/Nodes: There is right hilar and subcarinal adenopathy. There is a 6.6 x 6.6 x 4.4 cm posterior mediastinal mass immediately posterior to the left atrium extending to  the diaphragmatic hiatus immediately adjacent to the esophagus. The fat planes between the distal esophagus and this mass are obscured. The thyroid gland and trachea appear normal. Lungs/Pleura: The mass in the posterior mediastinum extends into the left hilum. There is narrowing of the right middle lobe and lower lobe bronchi. There is peripheral interstitial disease throughout both lungs, most prominent posteriorly at the right lung base. No discrete effusions. Upper Abdomen: There is adenopathy around the porta hepatis and around the inferior vena cava just below the liver. Musculoskeletal: There is a slight anterior wedge deformity of there is also a 9 mm lytic lesion in the superior aspect of T7, likely a benign Schmorl's node. PET scan does show metastatic disease in the sclerotic left pedicle of T7. There is a destructive expansile lesion of the anterior aspect of the right third rib. The PET scan demonstrated numerous metastatic lesions in the ribs and spine as well as in the scapulae. There is a subtle lytic lesion in the posteromedial aspect of the left ninth rib. Review of the MIP images confirms the above findings. IMPRESSION: 1. No pulmonary emboli. 2. Large mass in the posterior mediastinum extending into the left hilum with invasion of the left atrium. 3. Right hilar and subcarinal adenopathy consistent with metastatic disease. 4. Metastatic disease to the bones as described. 5. Probable benign Schmorl's  node in the superior aspect of the T7 vertebral body. Electronically Signed   By: Lorriane Shire M.D.   On: 08/03/2019 15:37   Ct Abdomen Pelvis W Contrast  Result Date: 08/03/2019 CLINICAL DATA:  Acute generalized abdominal pain. Foot EXAM: CT ABDOMEN AND PELVIS WITH CONTRAST TECHNIQUE: Multidetector CT imaging of the abdomen and pelvis was performed using the standard protocol following bolus administration of intravenous contrast. CONTRAST:  19mL OMNIPAQUE IOHEXOL 350 MG/ML SOLN COMPARISON:  None. FINDINGS: Lower chest: Posterior mediastinal mass invading the left atrium. Adenopathy adjacent to the distal esophagus just above the diaphragmatic hiatus. Hepatobiliary: There are 3 discrete metastases in the liver which correlate with the PET-CT scan. There is a 2 cm mass in the posterior aspect of the dome of the right lobe of the liver. There is a 17 mm lesion in the periphery of the right lobe and there is a 16 mm lesion in the inferior aspect of the left lobe of the liver. There is a single calcified gallstone. No dilated bile ducts. Prominent caudate lobe. Pancreas: Normal. Spleen: Normal. 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 Left adrenal gland appears normal. Bilateral renal cysts as demonstrated on the prior PET-CT scan. No hydronephrosis. Bladder appears normal. Stomach/Bowel: Stomach is within normal limits. Appendix appears normal. No evidence of bowel wall thickening, distention, or inflammatory changes. Multiple diverticula in the distal colon. The appendix is not discretely identified. Vascular/Lymphatic: Aortic atherosclerosis. Multiple enlarged lymph nodes in the porta hepatis and adjacent to the celiac axis. Reproductive: Prostate is unremarkable. Other: No abdominal wall hernia or abnormality. No abdominopelvic ascites. Musculoskeletal: The multiple metastatic lesions in the spine, pelvic bones, and proximal left femur are quite subtle on the CT scan. While the proximal left  femur lesions has a vague area of increased density best seen on image 102 of series 5. No discrete destructive bone lesions. IMPRESSION: 1. 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 2. Multiple enlarged lymph nodes in the porta hepatis and adjacent to the celiac axis. 3. Multiple metastatic lesions in the spine, pelvic bones, and proximal left femur. 4. No acute abnormalities  in the abdomen as compared to the prior PET-CT. Aortic Atherosclerosis (ICD10-I70.0). Electronically Signed   By: Lorriane Shire M.D.   On: 08/03/2019 15:47   Dg Chest Portable 1 View  Result Date: 08/03/2019 CLINICAL DATA:  Chest pain. EXAM: PORTABLE CHEST 1 VIEW COMPARISON:  None. FINDINGS: Mild cardiomegaly is noted. Hypoinflation of the lungs is noted. No pneumothorax is noted. Small pleural effusions may be present. Bilateral lung opacities are noted concerning for possible edema or inflammation. Bony thorax is unremarkable. IMPRESSION: Hypoinflation of the lungs is noted. Small pleural effusions may be present. Bilateral interstitial lung opacities are noted which may represent edema or inflammation. Electronically Signed   By: Marijo Conception M.D.   On: 08/03/2019 11:40         ASSESSMENT and PLAN:  1.  Metastatic cancer, most likely pulmonary origin: - PET CT scan on 08/01/2019 showed intensely hypermetabolic lung mass in the paramediastinal right lower lobe, probable direct mediastinal invasion.  Hypermetabolic subcarinal adenopathy.  Hypermetabolic bilateral level 2 nodes, right axilla and right retroperitoneum.  Multiple hypermetabolic liver metastasis.  Also noted hypermetabolic right adrenal and omental metastasis.  Scattered hypermetabolic soft tissue metastasis in the right chest wall as well as bilateral gluteal musculature.  Widespread bone mets throughout the axial and appendicular skeleton. -Patient is confused.  Hence would recommend CT of the brain with and without contrast.  Son reports  that he is very claustrophobic. - Patient son spoke to his oncologist at [facility] Dr. Merrie Roof.  Upon discharge, he will contact [facility] for visit and setting up biopsy to know the diagnosis and prognosis. -Case discussed with Dr. Dyann Kief.  2.  Leukocytosis: -He has white count ranging between 19-22.  Predominantly neutrophilic leukocytosis. -He is also on prednisone 5 mg for his interstitial lung disease.  Leukocytosis likely from steroids as well as from underlying malignancy. -His UA was positive for nitrite and negative for leukocytes on 08/03/2019. -He was treated with antibiotic.  3.  Chest pain: -Likely etiology is from tumor in the posterior mediastinum. - He will be discharged home on Vicodin per Dr. Sabra Heck.  All questions were answered. The patient knows to call the clinic with any problems, questions or concerns. We can certainly see the patient much sooner if necessary.    Derek Jack

## 2019-08-04 NOTE — Progress Notes (Signed)
Initial Nutrition Assessment  DOCUMENTATION CODES:   Not applicable  INTERVENTION:  -Ensure Enlive po BID, each supplement provides 350 kcal and 20 grams of protein -Magic cup TID with meals, each supplement provides 290 kcal and 9 grams of protein -MVI daily  NUTRITION DIAGNOSIS:   Increased nutrient needs related to cancer and cancer related treatments as evidenced by estimated needs.  GOAL:   Patient will meet greater than or equal to 90% of their needs   MONITOR:   PO intake, Supplement acceptance, Labs, Weight trends, I & O's  REASON FOR ASSESSMENT:   Malnutrition Screening Tool    ASSESSMENT:   73 year old male with past medical history significant for DM, HTN, COPD, and interstitial lung disease. Patient underwent incidental CT at the [facility] and lung mass identified.Patient presents to the ED with increasing pain to substernal area as well as rt upper quadrant. CT of chest, abdomen, pelvic showed widely metastatic disease in chest and abdomen; large mets to liver.  Patient recently moved to [place] from [place] to be close to his son. Per chart review pt unable to provide history in ED s/p pain medication. Son reports significantly decreased po intake over the past couple of days; npo yesterday.  Patient sitting up in bed, son at bedside chair at time of visit. Patient complaining of rt upper quadrant pain this morning. Breakfast tray in room (50% french toast, eggs, sausage) Son reports that is the first thing patient has eaten in 2 days. Son stated that patient has been eating mostly soups; drinks Ensure on occasion. RD encouraged daily ONS; 5-6 small frequent meals/snacks vs 2-3 large meals to assist with early satiety.   Patient amenable to receiving Ensure and Magic Cup during admission. RD provided contact information for purchasing Magic Cup from [facility] food services per son request.   UBW 200 lb per pt; denies recent wt changes Current wt 83.9 kg  (184.6 lb) No weight history for review  Medications reviewed and include: protonix, prednisone, SS novolog, IVF  Labs: CBGS 86-144 Ca corrects for low albumin 9.44 WBC 22.3 trending up  NUTRITION - FOCUSED PHYSICAL EXAM:    Most Recent Value  Orbital Region  Mild depletion  Upper Arm Region  Mild depletion  Thoracic and Lumbar Region  Unable to assess [d/t localized pain]  Buccal Region  Unable to assess [patient with full beard]  Temple Region  Mild depletion  Clavicle Bone Region  No depletion  Clavicle and Acromion Bone Region  No depletion  Scapular Bone Region  No depletion  Dorsal Hand  Mild depletion  Patellar Region  Moderate depletion  Anterior Thigh Region  Unable to assess  Posterior Calf Region  Mild depletion  Edema (RD Assessment)  None  Hair  Reviewed  Eyes  Reviewed  Mouth  Reviewed  Skin  Reviewed  Nails  Reviewed     Diet Order:   Diet Order            Diet regular Room service appropriate? Yes; Fluid consistency: Thin  Diet effective 0500              EDUCATION NEEDS:   No education needs have been identified at this time  Skin:  Skin Assessment: Reviewed RN Assessment  Last BM:  PTA  Height:   Ht Readings from Last 1 Encounters:  08/03/19 5\' 7"  (1.702 m)    Weight:   Wt Readings from Last 1 Encounters:  08/03/19 83.9 kg  Ideal Body Weight:  67.3 kg  BMI:  Body mass index is 28.98 kg/m.  Estimated Nutritional Needs:   Kcal:  2175-2330 (MSJ 1.4-1.5)  Protein:  109-118 grams (20% of needs)  Fluid:  >2.1L/day   Lajuan Lines, RD, LDN Office [phone] After Hours/Weekend Pager: [phone]

## 2019-08-05 ENCOUNTER — Other Ambulatory Visit: Payer: Self-pay

## 2019-08-05 ENCOUNTER — Encounter (HOSPITAL_COMMUNITY): Payer: Self-pay | Admitting: *Deleted

## 2019-08-05 ENCOUNTER — Observation Stay (HOSPITAL_COMMUNITY)
Admission: EM | Admit: 2019-08-05 | Discharge: 2019-08-06 | DRG: 948 | Disposition: A | Discharge status: Hospice | Payer: No Typology Code available for payment source | Attending: Family Medicine | Admitting: Family Medicine

## 2019-08-05 DIAGNOSIS — Z7952 Long term (current) use of systemic steroids: Secondary | ICD-10-CM

## 2019-08-05 DIAGNOSIS — C7971 Secondary malignant neoplasm of right adrenal gland: Secondary | ICD-10-CM | POA: Diagnosis not present

## 2019-08-05 DIAGNOSIS — Z794 Long term (current) use of insulin: Secondary | ICD-10-CM | POA: Diagnosis not present

## 2019-08-05 DIAGNOSIS — J449 Chronic obstructive pulmonary disease, unspecified: Secondary | ICD-10-CM | POA: Diagnosis present

## 2019-08-05 DIAGNOSIS — Z9981 Dependence on supplemental oxygen: Secondary | ICD-10-CM

## 2019-08-05 DIAGNOSIS — C786 Secondary malignant neoplasm of retroperitoneum and peritoneum: Secondary | ICD-10-CM | POA: Diagnosis not present

## 2019-08-05 DIAGNOSIS — J9611 Chronic respiratory failure with hypoxia: Secondary | ICD-10-CM | POA: Diagnosis present

## 2019-08-05 DIAGNOSIS — Z7189 Other specified counseling: Secondary | ICD-10-CM

## 2019-08-05 DIAGNOSIS — Z515 Encounter for palliative care: Secondary | ICD-10-CM | POA: Diagnosis not present

## 2019-08-05 DIAGNOSIS — Z885 Allergy status to narcotic agent status: Secondary | ICD-10-CM

## 2019-08-05 DIAGNOSIS — Z20828 Contact with and (suspected) exposure to other viral communicable diseases: Secondary | ICD-10-CM | POA: Diagnosis present

## 2019-08-05 DIAGNOSIS — C3490 Malignant neoplasm of unspecified part of unspecified bronchus or lung: Secondary | ICD-10-CM | POA: Diagnosis not present

## 2019-08-05 DIAGNOSIS — C7989 Secondary malignant neoplasm of other specified sites: Secondary | ICD-10-CM | POA: Diagnosis not present

## 2019-08-05 DIAGNOSIS — R110 Nausea: Secondary | ICD-10-CM

## 2019-08-05 DIAGNOSIS — Z66 Do not resuscitate: Secondary | ICD-10-CM | POA: Diagnosis not present

## 2019-08-05 DIAGNOSIS — C7951 Secondary malignant neoplasm of bone: Secondary | ICD-10-CM | POA: Diagnosis present

## 2019-08-05 DIAGNOSIS — I10 Essential (primary) hypertension: Secondary | ICD-10-CM | POA: Diagnosis not present

## 2019-08-05 DIAGNOSIS — Z96652 Presence of left artificial knee joint: Secondary | ICD-10-CM | POA: Diagnosis not present

## 2019-08-05 DIAGNOSIS — C799 Secondary malignant neoplasm of unspecified site: Secondary | ICD-10-CM | POA: Diagnosis not present

## 2019-08-05 DIAGNOSIS — G893 Neoplasm related pain (acute) (chronic): Principal | ICD-10-CM | POA: Diagnosis present

## 2019-08-05 DIAGNOSIS — C787 Secondary malignant neoplasm of liver and intrahepatic bile duct: Secondary | ICD-10-CM | POA: Diagnosis present

## 2019-08-05 DIAGNOSIS — R531 Weakness: Secondary | ICD-10-CM

## 2019-08-05 DIAGNOSIS — Z7982 Long term (current) use of aspirin: Secondary | ICD-10-CM

## 2019-08-05 DIAGNOSIS — R52 Pain, unspecified: Secondary | ICD-10-CM

## 2019-08-05 DIAGNOSIS — E119 Type 2 diabetes mellitus without complications: Secondary | ICD-10-CM | POA: Diagnosis not present

## 2019-08-05 LAB — CBC WITH DIFFERENTIAL/PLATELET
Abs Immature Granulocytes: 0.32 10*3/uL — ABNORMAL HIGH (ref 0.00–0.07)
Basophils Absolute: 0.1 10*3/uL (ref 0.0–0.1)
Basophils Relative: 1 %
Eosinophils Absolute: 1.1 10*3/uL — ABNORMAL HIGH (ref 0.0–0.5)
Eosinophils Relative: 5 %
HCT: 42.9 % (ref 39.0–52.0)
Hemoglobin: 13.2 g/dL (ref 13.0–17.0)
Immature Granulocytes: 2 %
Lymphocytes Relative: 9 %
Lymphs Abs: 1.9 10*3/uL (ref 0.7–4.0)
MCH: 27.8 pg (ref 26.0–34.0)
MCHC: 30.8 g/dL (ref 30.0–36.0)
MCV: 90.3 fL (ref 80.0–100.0)
Monocytes Absolute: 1.8 10*3/uL — ABNORMAL HIGH (ref 0.1–1.0)
Monocytes Relative: 8 %
Neutro Abs: 16.5 10*3/uL — ABNORMAL HIGH (ref 1.7–7.7)
Neutrophils Relative %: 75 %
Platelets: 301 10*3/uL (ref 150–400)
RBC: 4.75 MIL/uL (ref 4.22–5.81)
RDW: 13.9 % (ref 11.5–15.5)
WBC: 21.7 10*3/uL — ABNORMAL HIGH (ref 4.0–10.5)
nRBC: 0 % (ref 0.0–0.2)

## 2019-08-05 LAB — HEMOGLOBIN A1C
Hgb A1c MFr Bld: 5.9 % — ABNORMAL HIGH (ref 4.8–5.6)
Mean Plasma Glucose: 123 mg/dL

## 2019-08-05 LAB — COMPREHENSIVE METABOLIC PANEL
ALT: 11 U/L (ref 0–44)
AST: 13 U/L — ABNORMAL LOW (ref 15–41)
Albumin: 3 g/dL — ABNORMAL LOW (ref 3.5–5.0)
Alkaline Phosphatase: 59 U/L (ref 38–126)
Anion gap: 11 (ref 5–15)
BUN: 14 mg/dL (ref 8–23)
CO2: 28 mmol/L (ref 22–32)
Calcium: 9 mg/dL (ref 8.9–10.3)
Chloride: 97 mmol/L — ABNORMAL LOW (ref 98–111)
Creatinine, Ser: 0.94 mg/dL (ref 0.61–1.24)
GFR calc Af Amer: >60 mL/min (ref >60)
GFR calc non Af Amer: >60 mL/min (ref >60)
Glucose, Bld: 132 mg/dL — ABNORMAL HIGH (ref 70–99)
Potassium: 4 mmol/L (ref 3.5–5.1)
Sodium: 136 mmol/L (ref 135–145)
Total Bilirubin: 0.6 mg/dL (ref 0.3–1.2)
Total Protein: 7.1 g/dL (ref 6.5–8.1)

## 2019-08-05 LAB — URINALYSIS, ROUTINE W REFLEX MICROSCOPIC
Bilirubin Urine: NEGATIVE
Glucose, UA: NEGATIVE mg/dL
Hgb urine dipstick: NEGATIVE
Ketones, ur: NEGATIVE mg/dL
Leukocytes,Ua: NEGATIVE
Nitrite: NEGATIVE
Protein, ur: NEGATIVE mg/dL
Specific Gravity, Urine: 1.029 (ref 1.005–1.030)
pH: 5 (ref 5.0–8.0)

## 2019-08-05 LAB — LIPASE, BLOOD: Lipase: 17 U/L (ref 11–51)

## 2019-08-05 LAB — SARS CORONAVIRUS 2 BY RT PCR (HOSPITAL ORDER, PERFORMED IN ~~LOC~~ HOSPITAL LAB): SARS Coronavirus 2: NEGATIVE

## 2019-08-05 MED ORDER — HYDROCODONE-ACETAMINOPHEN 10-325 MG PO TABS
1.0000 | ORAL_TABLET | Freq: Four times a day (QID) | ORAL | Status: DC | PRN
  Administered 2019-08-06 – 2019-08-07 (×4): 1 via ORAL
  Filled 2019-08-05 (×6): qty 1

## 2019-08-05 MED ORDER — ONDANSETRON 4 MG PO TBDP: 4.0000 mg | ORAL_TABLET | Freq: Three times a day (TID) | ORAL | 0 refills | Status: DC | PRN

## 2019-08-05 MED ORDER — ONDANSETRON HCL 4 MG PO TABS: 4.0000 mg | ORAL_TABLET | Freq: Four times a day (QID) | ORAL | Status: DC | PRN

## 2019-08-05 MED ORDER — ATENOLOL 25 MG PO TABS
12.5000 mg | ORAL_TABLET | Freq: Every day | ORAL | Status: DC
  Administered 2019-08-06 – 2019-08-08 (×3): 12.5 mg via ORAL
  Filled 2019-08-05 (×4): qty 1

## 2019-08-05 MED ORDER — PANTOPRAZOLE SODIUM 40 MG PO TBEC
40.0000 mg | DELAYED_RELEASE_TABLET | Freq: Every day | ORAL | Status: DC
  Administered 2019-08-05: 40 mg via ORAL
  Filled 2019-08-05 (×2): qty 1

## 2019-08-05 MED ORDER — SODIUM CHLORIDE 0.9 % IV BOLUS
500.0000 mL | Freq: Once | INTRAVENOUS | Status: AC
  Administered 2019-08-05: 500 mL via INTRAVENOUS

## 2019-08-05 MED ORDER — ONDANSETRON HCL 4 MG/2ML IJ SOLN: 4.0000 mg | Freq: Four times a day (QID) | INTRAMUSCULAR | Status: DC | PRN

## 2019-08-05 MED ORDER — MORPHINE SULFATE (PF) 2 MG/ML IV SOLN
1.0000 mg | INTRAVENOUS | Status: DC | PRN
  Administered 2019-08-05 – 2019-08-07 (×2): 2 mg via INTRAVENOUS
  Filled 2019-08-05 (×2): qty 1

## 2019-08-05 MED ORDER — ASPIRIN EC 81 MG PO TBEC
81.0000 mg | DELAYED_RELEASE_TABLET | Freq: Every day | ORAL | Status: DC
  Administered 2019-08-05: 81 mg via ORAL
  Filled 2019-08-05 (×2): qty 1

## 2019-08-05 MED ORDER — ONDANSETRON HCL 4 MG/2ML IJ SOLN
4.0000 mg | Freq: Once | INTRAMUSCULAR | Status: AC
  Administered 2019-08-05: 4 mg via INTRAVENOUS
  Filled 2019-08-05: qty 2

## 2019-08-05 MED ORDER — SIMVASTATIN 10 MG PO TABS
10.0000 mg | ORAL_TABLET | Freq: Every day | ORAL | Status: DC
  Administered 2019-08-05: 10 mg via ORAL
  Filled 2019-08-05: qty 1

## 2019-08-05 MED ORDER — ADULT MULTIVITAMIN W/MINERALS CH
1.0000 | ORAL_TABLET | Freq: Every day | ORAL | Status: DC
  Administered 2019-08-05: 1 via ORAL
  Filled 2019-08-05 (×2): qty 1

## 2019-08-05 MED ORDER — HYDROCODONE-ACETAMINOPHEN 10-325 MG PO TABS
1.0000 | ORAL_TABLET | Freq: Once | ORAL | Status: AC
  Administered 2019-08-05: 1 via ORAL
  Filled 2019-08-05: qty 1

## 2019-08-05 MED ORDER — PREDNISONE 10 MG PO TABS
5.0000 mg | ORAL_TABLET | Freq: Every day | ORAL | Status: DC
  Filled 2019-08-05: qty 1

## 2019-08-05 MED ORDER — SODIUM CHLORIDE 0.9 % IV SOLN
INTRAVENOUS | Status: DC
  Administered 2019-08-05: 22:00:00 via INTRAVENOUS

## 2019-08-05 MED ORDER — DEXAMETHASONE SODIUM PHOSPHATE 4 MG/ML IJ SOLN
4.0000 mg | Freq: Once | INTRAMUSCULAR | Status: AC
  Administered 2019-08-05: 22:00:00 4 mg via INTRAVENOUS
  Filled 2019-08-05: qty 1

## 2019-08-05 MED ORDER — SENNOSIDES-DOCUSATE SODIUM 8.6-50 MG PO TABS
3.0000 | ORAL_TABLET | Freq: Two times a day (BID) | ORAL | Status: DC
  Administered 2019-08-05 – 2019-08-08 (×6): 3 via ORAL
  Filled 2019-08-05 (×6): qty 3

## 2019-08-05 MED ORDER — ALBUTEROL SULFATE (2.5 MG/3ML) 0.083% IN NEBU: 2.5000 mg | INHALATION_SOLUTION | RESPIRATORY_TRACT | Status: DC | PRN

## 2019-08-05 NOTE — Progress Notes (Addendum)
TOC CM spoke to pt's son, Andrew Roman. States pt does have oxygen at home but does not have a hospital bed. States he wants pt to go to a [place]. Contacted [facility] for Residential Hospice bed. TOC CM spoke to EDP about change in disposition. Son states pt is having difficulty with eating and had questions about pt's pain medications for home. Jonnie Finner RN CCM, [facility] ED TOC CM [phone]  6:35 pm Received call back from on call RN, [facility] full. Hospice referral was not complete, on call RN will follow up and get back to [facility] CM/SW tomorrow if pt was accepted. Will need acceptance from PCP or Hospice Medical Director. [facility] will order a hospital bed once he is accepted. Their DME comes from [place]. Attempted to call son, Andrew Roman with update. Left HIPAA compliant  Voice message. [place] [place] ED TOC CM [phone]

## 2019-08-05 NOTE — ED Provider Notes (Signed)
Emergency Department Provider Note   I have reviewed the triage vital signs and the nursing notes.   HISTORY  Chief Complaint Emesis   HPI Andrew Roman is a 73 y.o. male with PMH of lung mass with metastatic disease, COPD, DM, and HTN presents to the emergency department for evaluation of nausea with decreased appetite.  Patient was evaluated in the emergency department 2 days ago and briefly admitted with altered mental status.  CT scan of the head with and without contrast performed yesterday shows no acute findings or metastatic disease.  Patient returned home but returns today by EMS.  He describes nausea but denies abdominal pain.  No vomiting.  No diarrhea.  No fevers or chills.  EMS reports some confusion on scene but unclear baseline. Son is apparently en route to the ED.   Past Medical History:  Diagnosis Date  . Cancer (HCC)    lung mass  . COPD (chronic obstructive pulmonary disease) (HCC)    lung disease  . Diabetes mellitus without complication ([place])   . Hypertension     Patient Active Problem List   Diagnosis Date Noted  . Uncontrolled pain 08/05/2019  . Metastatic lung cancer (metastasis from lung to other site) ([place]) 08/05/2019  . Chronic respiratory failure with hypoxia ([place])   . Precordial pain   . Bandemia   . Metastatic cancer ([place]) 08/03/2019  . COPD (chronic obstructive pulmonary disease) ([place])   . Hypertension   . Diabetes mellitus without complication [facility])     Past Surgical History:  Procedure Laterality Date  . REPLACEMENT TOTAL KNEE Left     Allergies Morphine and related  No family history on file.  Social History Social History   Tobacco Use  . Smoking status: Never Smoker  . Smokeless tobacco: Never Used  Substance Use Topics  . Alcohol use: Never    Frequency: Never  . Drug use: Never    Review of Systems  Constitutional: No fever/chills Eyes: No visual changes. ENT: No sore throat. Cardiovascular: Denies chest pain.  Respiratory: Denies shortness of breath. Gastrointestinal: No abdominal pain. Positive nausea, no vomiting.  No diarrhea.  No constipation. Poor appetite.  Genitourinary: Negative for dysuria. Musculoskeletal: Negative for back pain. Skin: Negative for rash. Neurological: Negative for headaches, focal weakness or numbness.  10-point ROS otherwise negative.  ____________________________________________   PHYSICAL EXAM:  VITAL SIGNS: ED Triage Vitals  Enc Vitals Group     BP 08/05/19 1020 (!) 145/70     Pulse Rate 08/05/19 1020 60     Resp 08/05/19 1020 20     Temp 08/05/19 1020 97.9 F (36.6 C)     Temp Source 08/05/19 1020 Oral     SpO2 08/05/19 1005 99 %     Weight 08/05/19 1010 185 lb (83.9 kg)   Constitutional: Alert with short answers. Oriented to person, place. Well appearing and in no acute distress. Eyes: Conjunctivae are normal.  Head: Atraumatic. Nose: No congestion/rhinnorhea. Mouth/Throat: Mucous membranes are moist.  Oropharynx non-erythematous. Neck: No stridor.   Cardiovascular: Normal rate, regular rhythm. Good peripheral circulation. Grossly normal heart sounds.   Respiratory: Normal respiratory effort.  No retractions. Lungs CTAB. Gastrointestinal: Soft and nontender. No distention.  Musculoskeletal: No lower extremity tenderness nor edema. No gross deformities of extremities. Neurologic:  Normal speech and language. No gross focal neurologic deficits are appreciated.  Skin:  Skin is warm, dry and intact. No rash noted.  ____________________________________________   LABS (all labs ordered are listed,  but only abnormal results are displayed)  Labs Reviewed  COMPREHENSIVE METABOLIC PANEL - Abnormal; Notable for the following components:      Result Value   Chloride 97 (*)    Glucose, Bld 132 (*)    Albumin 3.0 (*)    AST 13 (*)    All other components within normal limits  CBC WITH DIFFERENTIAL/PLATELET - Abnormal; Notable for the following  components:   WBC 21.7 (*)    Neutro Abs 16.5 (*)    Monocytes Absolute 1.8 (*)    Eosinophils Absolute 1.1 (*)    Abs Immature Granulocytes 0.32 (*)    All other components within normal limits  LIPASE, BLOOD  URINALYSIS, ROUTINE W REFLEX MICROSCOPIC   ____________________________________________  RADIOLOGY  No results found.  ____________________________________________   PROCEDURES  Procedure(s) performed:   Procedures  None  ____________________________________________   INITIAL IMPRESSION / ASSESSMENT AND PLAN / ED COURSE  Pertinent labs & imaging results that were available during my care of the patient were reviewed by me and considered in my medical decision making (see chart for details).   Patient presents to the emergency department for evaluation of poor appetite with nausea.  He is recently diagnosed with metastatic disease likely from primary cancer in either the lung or esophagus.  He was discharged from the hospital yesterday with altered mental status thought to be secondary to pain medication.  Here, his mental status is similar to my exam from 2 days ago.  Son is apparently in route to provide additional history.  Patient is alert and oriented here.  CT head with and without contrast shows no metastatic disease which was performed yesterday.  No indication for repeat imaging at this time.  Patient not prescribed nausea medication at home.  Will give Zofran, IV fluids, repeat screening labs, reassess.  Blood test from today reviewed with no acute findings.  Patient feeling improved after IV fluids and Zofran.  Plan for discharge with Zofran after UA results.  Do not feel the patient would require admission if has a UTI but was sent home on antibiotics.  Case manager is seen the patient and coordinated with hospice of [facility] who will be following with the patient at home. Family updated regarding plan.  ____________________________________________   FINAL CLINICAL IMPRESSION(S) / ED DIAGNOSES  Final diagnoses:  Nausea  Generalized weakness  Metastatic cancer ([place])     MEDICATIONS GIVEN DURING THIS VISIT:  Medications  morphine 2 MG/ML injection 1-2 mg (has no administration in time range)  HYDROcodone-acetaminophen (NORCO) 10-325 MG per tablet 1 tablet (has no administration in time range)  senna-docusate (Senokot-S) tablet 3 tablet (has no administration in time range)  sodium chloride 0.9 % bolus 500 mL (0 mLs Intravenous Stopped 08/05/19 1131)  ondansetron (ZOFRAN) injection 4 mg (4 mg Intravenous Given 08/05/19 1041)  HYDROcodone-acetaminophen (NORCO) 10-325 MG per tablet 1 tablet (1 tablet Oral Given 08/05/19 1538)     NEW OUTPATIENT MEDICATIONS STARTED DURING THIS VISIT:  New Prescriptions   ONDANSETRON (ZOFRAN ODT) 4 MG DISINTEGRATING TABLET    Take 1 tablet (4 mg total) by mouth every 8 (eight) hours as needed for nausea.    Note:  This document was prepared using Dragon voice recognition software and may include unintentional dictation errors.  Nanda Quinton, MD, [facility]    Long, [facility], MD 08/05/19 [phone]

## 2019-08-05 NOTE — ED Notes (Signed)
Pt informed we need a urine sample. Unable to give one at this time. Son reports pt last urinated around 0900 this morning and it was "dark" in color.

## 2019-08-05 NOTE — ED Notes (Signed)
Pt has been given and able to tolerate small sips without vomiting or getting nauseated. Pt reports he is feeling better. Pt is more alert than earlier.

## 2019-08-05 NOTE — TOC Initial Note (Signed)
Transition of Care [facility]) - Initial/Assessment Note    Patient Details  Name: Andrew Roman MRN: [id number] Date of Birth: [birth year]  [facility] CM/SW Contact:    Trish Mage, LCSW Phone Number: 08/05/2019, 1:04 PM  Clinical Narrative:    Met with patient per consult order, son at bedside and given permission by patient to talk to son. Patient was somewhat confused during my brief attempt to interview.   Son states father is recently diagnosed stage 4 cancer.  He was on phone call with oncologist yesterday with patient, and was told father is likely not strong enough to withstand treatment.  We contacted Cassandra from [facility], who spoke with son at length and agreed to provide services starting immediately.  Patient will return home with son supporting him with help of hospice.  Plan is to move to [facility] when appropriate. Requested order from Dr for community hospice care.  No further TOC needs.              Expected Discharge Plan: Home w Hospice Care Barriers to Discharge: No Barriers Identified   Patient Goals and CMS Choice Patient states their goals for this hospitalization and ongoing recovery are:: Son wished for his father to be comfortable, get hospice support      Expected Discharge Plan and Services Expected Discharge Plan: [place]   Discharge Planning Services: CM Consult   Living arrangements for the past 2 months: Apartment                                      Prior Living Arrangements/Services Living arrangements for the past 2 months: Apartment Lives with:: Self Patient language and need for interpreter reviewed:: Yes Do you feel safe going back to the place where you live?: Yes      Need for Family Participation in Patient Care: Yes (Comment) Care giver support system in place?: Yes (comment)   Criminal Activity/Legal Involvement Pertinent to Current Situation/Hospitalization: No - Comment as  needed  Activities of Daily Living      Permission Sought/Granted Permission sought to share information with : Family Supports Permission granted to share information with : Yes, Verbal Permission Granted  Share Information with NAME: Timo Hartwig     Permission granted to share info w Relationship: son  Permission granted to share info w Contact Information: [phone]  Emotional Assessment Appearance:: Appears stated age Attitude/Demeanor/Rapport: Engaged Affect (typically observed): Blunt Orientation: : Oriented to Self Alcohol / Substance Use: Not Applicable Psych Involvement: No (comment)  Admission diagnosis:  Nausea/Weakness Patient Active Problem List   Diagnosis Date Noted  . Chronic respiratory failure with hypoxia ([place])   . Precordial pain   . Bandemia   . Metastatic cancer ([place]) 08/03/2019  . COPD (chronic obstructive pulmonary disease) ([place])   . Hypertension   . Diabetes mellitus without complication [facility])    PCP:  Clinic, [place]:  No Pharmacies Listed    Social Determinants of Health (SDOH) Interventions    Readmission Risk Interventions No flowsheet data found.

## 2019-08-05 NOTE — ED Triage Notes (Signed)
Pt brought in by [facility] with c/o nausea x the last week. Pt denies vomiting, abdominal pain. Pt reports it's been a couple days since he has been able to eat or drink. Pt is somewhat confused, unknown if this is pt's normal. Pt was recently diagnosed with lung cancer and wears home O2 at 5L via [place]. Pt was seen here 2 days ago with similar symptoms.

## 2019-08-05 NOTE — Discharge Instructions (Signed)
You were seen in the emergency department today with generalized weakness and nausea.  You were given IV fluids.  I have written a prescription for Zofran which she can fill and take as needed for nausea.  Please follow-up with your oncologist.  We have also arranged for hospice of [facility] to be in contact with you regarding home health/hospice services.  I have included their contact information in his discharge paperwork.

## 2019-08-05 NOTE — Progress Notes (Signed)
CSW and [facility] consults again for this patient. CSW/RNCM spoke with nurse who reported that family is now stating that they are not able to manage pts needs and are not assuming care for this pt over the weekend while they wait for community hospice to step in.   TOC team attempting to follow up now.   [place] Transitions of Care  Clinical Social Worker  Ph: [phone]

## 2019-08-05 NOTE — ED Notes (Addendum)
Daughter at bedside and RN inquired about home hospice status with pt. Daughter reported that her brother told her that hospice could not come out until Monday. Daughter said RN needed to speak with her brother who makes "the decisions because I don't live around here" about who would be taking care of him until Monday when hospice could come out. RN spoke with Helene Kelp, son at [phone] at he was very adamant that it was not safe for pt to return home in his care. He reported that he didn't feel safe taking care of pt in his current condition. He felt that pt was too much of a fall risk and would end up coming back with a broken hip or worse and that his home pain medications were not appropriate for his level of pain. Son felt that pt needed more pain medication. [place], LCSW called and notified of pt's situation. S[place]hire looking into pt's case and will return call to RN.

## 2019-08-05 NOTE — H&P (Addendum)
TRH H&P   Patient Demographics:    Andrew Roman, is a 73 y.o. male  MRN: [id number]   DOB - Jun 03, 1946  Admit Date - 08/05/2019  Outpatient Primary MD for the patient is [facility]  Referring MD/NP/PA: dr Thurnell Garbe  Patient coming from: Home  Chief Complaint  Patient presents with  . Emesis      HPI:    Andrew Roman  is a 73 y.o. 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 -Upon my discussion with the son, apparently a plan is to go to hospice home if possible, the patient will be admitted for pain control and will consult hospice in a.m.Marland Kitchen   Review of systems:    Patient unable to provide appropriate review of systems,   With Past History of the following :    Past Medical History:  Diagnosis Date  . Cancer (HCC)    lung mass  . COPD (chronic obstructive pulmonary disease) (HCC)    lung disease  . Diabetes mellitus without complication ([place])   . Hypertension       Past Surgical History:  Procedure Laterality Date  . REPLACEMENT TOTAL KNEE Left        Social History:     Social History   Tobacco Use  . Smoking status: Never Smoker  . Smokeless tobacco: Never Used  Substance Use Topics  . Alcohol use: Never    Frequency: Never        Family History :   Family history was reviewed, no pertinent family history   Home Medications:   Prior to Admission medications   Medication Sig Start Date End Date Taking? Authorizing Provider  aspirin EC 81 MG tablet Take 81 mg by mouth daily.    [provider]  atenolol (TENORMIN) 25 MG tablet Take 12.5 mg by mouth daily.     [provider]  docusate sodium (COLACE) 100 MG capsule Take 1 capsule (100 mg total) by mouth 2 (two) times daily. 08/04/19 08/03/20  Barton Dubois, MD  HYDROcodone-Acetaminophen 10-300 MG TABS Take 1 tablet by mouth every 8 (eight) hours as needed for up to 14 days (severe pain). 08/04/19 08/18/19  Barton Dubois, MD  insulin NPH-regular Human (70-30) 100 UNIT/ML injection Inject 8 Units into the skin 2 (two) times daily with a meal.    [provider]  Multiple Vitamin (MULTIVITAMIN) tablet Take 1 tablet by mouth daily.    [provider]  omeprazole (PRILOSEC) 40 MG capsule Take 40 mg by mouth daily.    [provider]  ondansetron (ZOFRAN ODT) 4 MG disintegrating tablet Take 1 tablet (4 mg total) by mouth every 8 (eight) hours as needed for nausea. 08/05/19   Long, Wonda [facility], MD  polyethylene glycol (MIRALAX) 17 g packet Take 17 g by mouth daily as needed. 08/04/19   Barton Dubois, MD  predniSONE (DELTASONE) 5 MG tablet Take 5 mg by mouth daily with breakfast.    [provider]  simvastatin (ZOCOR) 10 MG tablet Take 10 mg by mouth at bedtime.    [provider]     Allergies:     Allergies  Allergen Reactions  . Morphine And Related      Physical Exam:   Vitals  Blood pressure 107/67, pulse (!) 57, temperature 97.9 F (36.6 C), temperature source Oral, resp. rate (!) 21, height 5\' 7"  (1.702  m), weight 86.9 kg, SpO2 100 %.   1. General frail, sick appearing elderly male, laying in bed in mild discomfort  2.  Mildly confused, forgetful, easily distracted,   3. No F.N deficits, ALL C.Nerves Intact, Strength 5/5 all 4 extremities, Sensation intact all 4 extremities, Plantars down going.  4. Ears and Eyes appear Normal, Conjunctivae clear, PERRLA. Moist Oral Mucosa.  5. Supple Neck, No JVD, No cervical lymphadenopathy appriciated, No Carotid Bruits.  6. Symmetrical Chest wall movement, Good air movement bilaterally, CTAB.  7. RRR, No Gallops, Rubs or Murmurs, No Parasternal Heave.  8. Positive Bowel Sounds, Abdomen Soft, No tenderness, No organomegaly appriciated,No rebound -guarding or rigidity.  9.  No Cyanosis, Normal Skin Turgor, No Skin Rash or Bruise.  10. Good muscle tone,  joints appear normal , no effusions, Normal ROM.  11. No Palpable Lymph Nodes in Neck or Axillae    Data Review:    CBC Recent Labs  Lab 08/03/19 1111 08/04/19 0443 08/05/19 1040  WBC 19.9* 22.3* 21.7*  HGB 14.2 13.0 13.2  HCT 46.8 42.5 42.9  PLT 323 300 301  MCV 90.5 91.0 90.3  MCH 27.5 27.8 27.8  MCHC 30.3 30.6 30.8  RDW 14.1 13.7 13.9  LYMPHSABS 2.1  --  1.9  MONOABS 2.1*  --  1.8*  EOSABS 1.5*  --  1.1*  BASOSABS 0.1  --  0.1   ------------------------------------------------------------------------------------------------------------------  Chemistries  Recent Labs  Lab 08/03/19 1111 08/04/19 0443 08/05/19 1040  NA 136 137 136  K 4.1 4.5 4.0  CL 94* 98 97*  CO2 29 28 28   GLUCOSE 111* 110* 132*  BUN 10 12 14   CREATININE 1.01 0.95 0.94  CALCIUM 9.2 8.8* 9.0  AST 14*  --  13*  ALT 14  --  11  ALKPHOS 68  --  59  BILITOT 1.4*  --  0.6   ------------------------------------------------------------------------------------------------------------------ estimated creatinine clearance is 74.8 mL/min (by C-G formula based on SCr of 0.94 mg/dL).  ------------------------------------------------------------------------------------------------------------------ No results for input(s): TSH, T4TOTAL, T3FREE, THYROIDAB in the last 72 hours.  Invalid input(s): FREET3  Coagulation profile Recent Labs  Lab 08/03/19 1111  INR 1.2   ------------------------------------------------------------------------------------------------------------------- No results for input(s): DDIMER in the last 72 hours. -------------------------------------------------------------------------------------------------------------------  Cardiac Enzymes No results  for input(s): CKMB, TROPONINI, MYOGLOBIN in the last 168 hours.  Invalid input(s): CK ------------------------------------------------------------------------------------------------------------------ No results found for: BNP   ---------------------------------------------------------------------------------------------------------------  Urinalysis    Component Value Date/Time   COLORURINE YELLOW 08/05/2019 [place] 08/05/2019 1705   LABSPEC 1.029 08/05/2019 1705   PHURINE 5.0 08/05/2019 1705   GLUCOSEU NEGATIVE 08/05/2019 1705   HGBUR NEGATIVE 08/05/2019 1705   BILIRUBINUR NEGATIVE 08/05/2019 [place] 08/05/2019 1705   PROTEINUR NEGATIVE 08/05/2019 1705   NITRITE NEGATIVE 08/05/2019 1705   LEUKOCYTESUR NEGATIVE 08/05/2019 1705    ----------------------------------------------------------------------------------------------------------------   Imaging Results:    Ct Head W & Wo Contrast  Result Date: 08/04/2019 CLINICAL DATA:  Metastatic carcinoma. EXAM: CT HEAD WITHOUT AND WITH CONTRAST TECHNIQUE: Contiguous axial images were obtained from the base of the skull through the vertex without and with intravenous contrast CONTRAST:  72mL OMNIPAQUE IOHEXOL 300 MG/ML  SOLN COMPARISON:  None. FINDINGS: Brain: Mild generalized atrophy. Chronic small-vessel  ischemic changes of the cerebral hemispheric white matter. No sign of recent infarction, intra-axial mass lesion, hemorrhage, hydrocephalus or extra-axial collection. No abnormal enhancement occurs. Vascular: There is atherosclerotic calcification of the major vessels at the base of the brain. Skull: Negative Sinuses/Orbits: Clear/normal Other: None IMPRESSION: No evidence of metastatic disease to the brain, leptomeninges or skull. Atrophy and chronic small-vessel ischemic change of the white matter. Electronically Signed   By: Nelson Chimes M.D.   On: 08/04/2019 15:07      Assessment & Plan:    Active Problems:   Metastatic cancer (HCC)   COPD (chronic obstructive pulmonary disease) (HCC)   Diabetes mellitus without complication (HCC)   Chronic respiratory failure with hypoxia (HCC)   Uncontrolled pain   Metastatic lung cancer (metastasis from lung to other site) [facility])  Metastatic cancer, most likely pulmonary in origin, with uncontrolled pain. - PET CT scan on 08/01/2019 showed intensely hypermetabolic lung mass in the paramediastinal right lower lobe, probable direct mediastinal invasion.  Hypermetabolic subcarinal adenopathy.  Hypermetabolic bilateral level 2 nodes, right axilla and right retroperitoneum.  Multiple hypermetabolic liver metastasis.  Also noted hypermetabolic right adrenal and omental metastasis.  Scattered hypermetabolic soft tissue metastasis in the right chest wall as well as bilateral gluteal musculature.  Widespread bone mets throughout the axial and appendicular skeleton.  Patient was seen by oncology during most recent visit. -Discussed with at son and patient with length, at this point they do not wish for any further work-up, or treatment for this malignancy, and the main goal is comfort, patient was quite uncomfortable with poor pain control at home and they do request hospice, and possible charge to hospice house. -Patient will be admitted to the hospital, especially with  known widely metastatic disease to include multiple abdominal organs, and multiple bones, so he will be on IV morphine, and PRN Vicodin, for pain control. -will Give 1 dose of IV Decadron hopefully this will help with bone pain. -We will consult hospice services in a.m., to see if placement in hospice house is possible. -No further labs, or work-up, main goal is comfort at this point.  DVT Prophylaxis SCDs  AM Labs Ordered, also please review Full Orders  Family Communication: Admission, patients condition and plan of care including tests being ordered have been discussed with the patient and Son who indicate understanding and agree with the plan and Code Status.  Code Status DNR  Likely DC to  Hospice Home  Condition GUARDED    Consults called:  None  Admission status: Observation  Time spent in minutes : 50 minutes   Phillips Climes M.D on 08/05/2019 at 9:15 PM  Between 7am to 7pm - Pager - [phone]. After 7pm go to [email or web address] - password [email or web address]  [facility] - Office  [phone]

## 2019-08-05 NOTE — ED Notes (Signed)
Hospitalist at bedside 

## 2019-08-06 ENCOUNTER — Inpatient Hospital Stay (HOSPITAL_COMMUNITY)
Admission: RE | Admit: 2019-08-06 | Discharge: 2019-08-09 | DRG: 948 | Disposition: A | Discharge status: Hospice | Attending: Family Medicine | Admitting: Family Medicine

## 2019-08-06 DIAGNOSIS — Z794 Long term (current) use of insulin: Secondary | ICD-10-CM | POA: Diagnosis not present

## 2019-08-06 DIAGNOSIS — J9610 Chronic respiratory failure, unspecified whether with hypoxia or hypercapnia: Secondary | ICD-10-CM | POA: Diagnosis present

## 2019-08-06 DIAGNOSIS — Z7982 Long term (current) use of aspirin: Secondary | ICD-10-CM | POA: Diagnosis not present

## 2019-08-06 DIAGNOSIS — Z7952 Long term (current) use of systemic steroids: Secondary | ICD-10-CM | POA: Diagnosis not present

## 2019-08-06 DIAGNOSIS — R52 Pain, unspecified: Secondary | ICD-10-CM

## 2019-08-06 DIAGNOSIS — C3490 Malignant neoplasm of unspecified part of unspecified bronchus or lung: Secondary | ICD-10-CM

## 2019-08-06 DIAGNOSIS — C3431 Malignant neoplasm of lower lobe, right bronchus or lung: Secondary | ICD-10-CM | POA: Diagnosis present

## 2019-08-06 DIAGNOSIS — Z515 Encounter for palliative care: Secondary | ICD-10-CM

## 2019-08-06 DIAGNOSIS — J42 Unspecified chronic bronchitis: Secondary | ICD-10-CM

## 2019-08-06 DIAGNOSIS — E119 Type 2 diabetes mellitus without complications: Secondary | ICD-10-CM

## 2019-08-06 DIAGNOSIS — I10 Essential (primary) hypertension: Secondary | ICD-10-CM | POA: Diagnosis present

## 2019-08-06 DIAGNOSIS — C799 Secondary malignant neoplasm of unspecified site: Secondary | ICD-10-CM | POA: Diagnosis present

## 2019-08-06 DIAGNOSIS — Z79899 Other long term (current) drug therapy: Secondary | ICD-10-CM | POA: Diagnosis not present

## 2019-08-06 DIAGNOSIS — J849 Interstitial pulmonary disease, unspecified: Secondary | ICD-10-CM | POA: Diagnosis present

## 2019-08-06 DIAGNOSIS — J9611 Chronic respiratory failure with hypoxia: Secondary | ICD-10-CM

## 2019-08-06 DIAGNOSIS — Z96652 Presence of left artificial knee joint: Secondary | ICD-10-CM | POA: Diagnosis present

## 2019-08-06 DIAGNOSIS — Z885 Allergy status to narcotic agent status: Secondary | ICD-10-CM | POA: Diagnosis not present

## 2019-08-06 DIAGNOSIS — Z66 Do not resuscitate: Secondary | ICD-10-CM | POA: Diagnosis present

## 2019-08-06 DIAGNOSIS — J449 Chronic obstructive pulmonary disease, unspecified: Secondary | ICD-10-CM | POA: Diagnosis present

## 2019-08-06 DIAGNOSIS — G893 Neoplasm related pain (acute) (chronic): Secondary | ICD-10-CM | POA: Diagnosis present

## 2019-08-06 MED ORDER — INFLUENZA VAC A&B SA ADJ QUAD 0.5 ML IM PRSY: 0.5000 mL | PREFILLED_SYRINGE | INTRAMUSCULAR | Status: DC

## 2019-08-06 NOTE — Progress Notes (Signed)
Nutrition Brief Note RD working remotely.  Chart reviewed. Pt now transitioning to comfort care.  No further nutrition interventions warranted at this time.  Please consult as needed.   Lajuan Lines, [place], [place] [phone] After Hours/Weekend Pager: [phone]

## 2019-08-06 NOTE — Progress Notes (Signed)
PROGRESS NOTE Andrew Roman  KWI:[id number]  DOB: Feb 01, 1946  DOA: 08/05/2019 PCP: [facility]  Brief Admission Hx: 73 y.o. male, with past medical history significant for hypertension, diabetes mellitus, chronic respiratory failure on 5 L nasal cannula at baseline, interstitial lung disease, recent diagnosis of metastatic lung malignancy, was recently hospitalized secondary to encephalopathy and now presents with uncontrolled pain and family not able to care for at home and requesting direct placement to [facility].     MDM/Assessment & Plan:   1. Metastatic lung cancer - Pt not deemed medically stable for aggressive treatments per his oncologist and family now requesting placement in hospice facility and are unable to manage his care at home and brought him back to the hospital after being discharged 2 days ago.  I have asked for social worker to make a referral to residential hospice.  His prognosis remains poor and anticipate that he may expire in 2 weeks or less given the advanced metastatic nature of his disease. Will focus on comfort and dignity.    Code Status: DNR  Disposition Plan: residential hospice when bed available, currently no beds available.   Subjective: Pt reports he wants to eat breakfast.   Objective: Vitals:   08/05/19 2051 08/05/19 2208 08/06/19 0530 08/06/19 0756  BP: 107/67  (!) 155/84   Pulse: (!) 57  81   Resp: (!) 21 20 19    Temp:   97.9 F (36.6 C)   TempSrc:      SpO2: 100%  100% 99%  Weight: 86.9 kg     Height: 5\' 7"  (1.702 m)       Intake/Output Summary (Last 24 hours) at 08/06/2019 1037 Last data filed at 08/06/2019 0600 Gross per 24 hour  Intake 904.29 ml  Output 700 ml  Net 204.29 ml   Filed Weights   08/05/19 1010 08/05/19 2051  Weight: 83.9 kg 86.9 kg   REVIEW OF SYSTEMS  As per history otherwise all reviewed and reported negative  Exam:  General exam: chronically ill appearing male, awake, alert, NAD.  Cooperative.  Respiratory system: shallow BS bilateral.  Cardiovascular system: S1 & S2 heard. No JVD, murmurs, gallops, clicks or pedal edema. Gastrointestinal system: Abdomen is nondistended, soft and nontender. Normal bowel sounds heard. Central nervous system: Alert and oriented. No focal neurological deficits. Extremities: no CCE.  Data Reviewed: Basic Metabolic Panel: Recent Labs  Lab 08/03/19 1111 08/04/19 0443 08/05/19 1040  NA 136 137 136  K 4.1 4.5 4.0  CL 94* 98 97*  CO2 29 28 28   GLUCOSE 111* 110* 132*  BUN 10 12 14   CREATININE 1.01 0.95 0.94  CALCIUM 9.2 8.8* 9.0   Liver Function Tests: Recent Labs  Lab 08/03/19 1111 08/05/19 1040  AST 14* 13*  ALT 14 11  ALKPHOS 68 59  BILITOT 1.4* 0.6  PROT 7.5 7.1  ALBUMIN 3.2* 3.0*   Recent Labs  Lab 08/03/19 1111 08/05/19 1040  LIPASE 20 17   No results for input(s): AMMONIA in the last 168 hours. CBC: Recent Labs  Lab 08/03/19 1111 08/04/19 0443 08/05/19 1040  WBC 19.9* 22.3* 21.7*  NEUTROABS 13.9*  --  16.5*  HGB 14.2 13.0 13.2  HCT 46.8 42.5 42.9  MCV 90.5 91.0 90.3  PLT 323 300 301   Cardiac Enzymes: No results for input(s): CKTOTAL, CKMB, CKMBINDEX, TROPONINI in the last 168 hours. CBG (last 3)  Recent Labs    08/03/19 2050 08/04/19 0722 08/04/19 1110  GLUCAP 97  86 144*   Recent Results (from the past 240 hour(s))  Urine culture     Status: None   Collection Time: 08/03/19 11:12 AM   Specimen: Urine, Clean Catch  Result Value Ref Range Status   Specimen Description   Final    URINE, CLEAN CATCH Performed at [facility] [place], [address] [place], [place] [address]    Special Requests   Final    Normal Performed at [facility], [address], [place], [place] [address]    Culture   Final    Multiple bacterial morphotypes present, none predominant. Suggest appropriate recollection if clinically indicated.   Report Status 08/04/2019 FINAL  Final  SARS Coronavirus 2 [facility] order,  Performed in [facility] hospital lab) Nasopharyngeal Nasopharyngeal Swab     Status: None   Collection Time: 08/03/19 11:13 AM   Specimen: Nasopharyngeal Swab  Result Value Ref Range Status   SARS Coronavirus 2 NEGATIVE NEGATIVE Final    Comment: (NOTE) If result is NEGATIVE SARS-CoV-2 target nucleic acids are NOT DETECTED. The SARS-CoV-2 RNA is generally detectable in upper and lower  respiratory specimens during the acute phase of infection. The lowest  concentration of SARS-CoV-2 viral copies this assay can detect is 250  copies / mL. A negative result does not preclude SARS-CoV-2 infection  and should not be used as the sole basis for treatment or other  patient management decisions.  A negative result may occur with  improper specimen collection / handling, submission of specimen other  than nasopharyngeal swab, presence of viral mutation(s) within the  areas targeted by this assay, and inadequate number of viral copies  (<250 copies / mL). A negative result must be combined with clinical  observations, patient history, and epidemiological information. If result is POSITIVE SARS-CoV-2 target nucleic acids are DETECTED. The SARS-CoV-2 RNA is generally detectable in upper and lower  respiratory specimens dur ing the acute phase of infection.  Positive  results are indicative of active infection with SARS-CoV-2.  Clinical  correlation with patient history and other diagnostic information is  necessary to determine patient infection status.  Positive results do  not rule out bacterial infection or co-infection with other viruses. If result is PRESUMPTIVE POSTIVE SARS-CoV-2 nucleic acids MAY BE PRESENT.   A presumptive positive result was obtained on the submitted specimen  and confirmed on repeat testing.  While 2019 novel coronavirus  (SARS-CoV-2) nucleic acids may be present in the submitted sample  additional confirmatory testing may be necessary for epidemiological  and / or  clinical management purposes  to differentiate between  SARS-CoV-2 and other Sarbecovirus currently known to infect humans.  If clinically indicated additional testing with an alternate test  methodology [phone]) is advised. The SARS-CoV-2 RNA is generally  detectable in upper and lower respiratory sp ecimens during the acute  phase of infection. The expected result is Negative. Fact Sheet for Patients:  StrictlyIdeas.no Fact Sheet for Healthcare Providers: BankingDealers.co.za This test is not yet approved or cleared by the [place] FDA and has been authorized for detection and/or diagnosis of SARS-CoV-2 by FDA under an Emergency Use Authorization (EUA).  This EUA will remain in effect (meaning this test can be used) for the duration of the COVID-19 declaration under Section 564(b)(1) of the Act, 21 U.S.C. section 360bbb-3(b)(1), unless the authorization is terminated or revoked sooner. Performed at [facility], [address], [place], [place] [address]   Culture, blood (routine x 2)     Status: None (Preliminary  result)   Collection Time: 08/03/19 11:45 AM   Specimen: Right Antecubital; Blood  Result Value Ref Range Status   Specimen Description RIGHT ANTECUBITAL  Final   Special Requests   Final    BOTTLES DRAWN AEROBIC AND ANAEROBIC Blood Culture adequate volume   Culture   Final    NO GROWTH 3 DAYS Performed at [facility], [address], [place], [place] [address]    Report Status PENDING  Incomplete  Culture, blood (routine x 2)     Status: None (Preliminary result)   Collection Time: 08/03/19 11:54 AM   Specimen: BLOOD LEFT FOREARM  Result Value Ref Range Status   Specimen Description BLOOD LEFT FOREARM  Final   Special Requests   Final    BOTTLES DRAWN AEROBIC AND ANAEROBIC Blood Culture adequate volume   Culture   Final    NO GROWTH 3 DAYS Performed at [facility], [address], [place],  [address]     Report Status PENDING  Incomplete  SARS Coronavirus 2 [facility] order, Performed in [place] hospital lab) Nasopharyngeal Nasopharyngeal Swab     Status: None   Collection Time: 08/05/19  7:55 PM   Specimen: Nasopharyngeal Swab  Result Value Ref Range Status   SARS Coronavirus 2 NEGATIVE NEGATIVE Final    Comment: (NOTE) If result is NEGATIVE SARS-CoV-2 target nucleic acids are NOT DETECTED. The SARS-CoV-2 RNA is generally detectable in upper and lower  respiratory specimens during the acute phase of infection. The lowest  concentration of SARS-CoV-2 viral copies this assay can detect is 250  copies / mL. A negative result does not preclude SARS-CoV-2 infection  and should not be used as the sole basis for treatment or other  patient management decisions.  A negative result may occur with  improper specimen collection / handling, submission of specimen other  than nasopharyngeal swab, presence of viral mutation(s) within the  areas targeted by this assay, and inadequate number of viral copies  (<250 copies / mL). A negative result must be combined with clinical  observations, patient history, and epidemiological information. If result is POSITIVE SARS-CoV-2 target nucleic acids are DETECTED. The SARS-CoV-2 RNA is generally detectable in upper and lower  respiratory specimens dur ing the acute phase of infection.  Positive  results are indicative of active infection with SARS-CoV-2.  Clinical  correlation with patient history and other diagnostic information is  necessary to determine patient infection status.  Positive results do  not rule out bacterial infection or co-infection with other viruses. If result is PRESUMPTIVE POSTIVE SARS-CoV-2 nucleic acids MAY BE PRESENT.   A presumptive positive result was obtained on the submitted specimen  and confirmed on repeat testing.  While 2019 novel coronavirus  (SARS-CoV-2) nucleic acids may be present in the submitted sample    additional confirmatory testing may be necessary for epidemiological  and / or clinical management purposes  to differentiate between  SARS-CoV-2 and other Sarbecovirus currently known to infect humans.  If clinically indicated additional testing with an alternate test  methodology [phone]) is advised. The SARS-CoV-2 RNA is generally  detectable in upper and lower respiratory sp ecimens during the acute  phase of infection. The expected result is Negative. Fact Sheet for Patients:  StrictlyIdeas.no Fact Sheet for Healthcare Providers: BankingDealers.co.za This test is not yet approved or cleared by the [place] FDA and has been authorized for detection and/or diagnosis of SARS-CoV-2 by FDA under an Emergency Use Authorization (EUA).  This EUA will remain  in effect (meaning this test can be used) for the duration of the COVID-19 declaration under Section 564(b)(1) of the Act, 21 U.S.C. section 360bbb-3(b)(1), unless the authorization is terminated or revoked sooner. Performed at [facility], [address], [place], [place] [address]      Studies: Ct Head W & Wo Contrast  Result Date: 08/04/2019 CLINICAL DATA:  Metastatic carcinoma. EXAM: CT HEAD WITHOUT AND WITH CONTRAST TECHNIQUE: Contiguous axial images were obtained from the base of the skull through the vertex without and with intravenous contrast CONTRAST:  21mL OMNIPAQUE IOHEXOL 300 MG/ML  SOLN COMPARISON:  None. FINDINGS: Brain: Mild generalized atrophy. Chronic small-vessel ischemic changes of the cerebral hemispheric white matter. No sign of recent infarction, intra-axial mass lesion, hemorrhage, hydrocephalus or extra-axial collection. No abnormal enhancement occurs. Vascular: There is atherosclerotic calcification of the major vessels at the base of the brain. Skull: Negative Sinuses/Orbits: Clear/normal Other: None IMPRESSION: No evidence of metastatic disease to the brain,  leptomeninges or skull. Atrophy and chronic small-vessel ischemic change of the white matter. Electronically Signed   By: Nelson Chimes M.D.   On: 08/04/2019 15:07   Scheduled Meds:  aspirin EC  81 mg Oral Daily   atenolol  12.5 mg Oral Daily   [START ON 08/07/2019] influenza vaccine adjuvanted  0.5 mL Intramuscular Tomorrow-1000   multivitamin with minerals  1 tablet Oral Daily   pantoprazole  40 mg Oral Daily   predniSONE  5 mg Oral Q breakfast   senna-docusate  3 tablet Oral BID   simvastatin  10 mg Oral QHS   Continuous Infusions:  sodium chloride 50 mL/hr at 08/05/19 2153   Active Problems:   Metastatic cancer (HCC)   COPD (chronic obstructive pulmonary disease) (HCC)   Diabetes mellitus without complication (HCC)   Chronic respiratory failure with hypoxia (HCC)   Uncontrolled pain   Metastatic lung cancer (metastasis from lung to other site) [facility])  Time spent:   Irwin Brakeman, MD [facility] 08/06/2019, 10:37 AM    LOS: 0 days  How to contact the [facility] Attending or Consulting provider [place] or covering provider during after hours [place], for this patient?  1. Check the care team in [facility] and look for a) attending/consulting TRH provider listed and b) the [facility] team listed 2. Log into [email or web address] and use [facility] universal password to access. If you do not have the password, please contact the hospital operator. 3. Locate the [facility] provider you are looking for under [facility] and page to a number that you can be directly reached. 4. If you still have difficulty reaching the provider, please page the [facility] (Director on Call) for the Hospitalists listed on amion for assistance.

## 2019-08-06 NOTE — Progress Notes (Signed)
Visited with Patient and his son Delfino Lovett. Patient expressed concern for spiritual support and prayer. Patient expressed that he would like to get well and return home and he looking forward to that.

## 2019-08-06 NOTE — Clinical Social Work Note (Signed)
LCSW spoke with patient's son, Delfino Lovett, who confirming that family wanted to utilize [facility].   Lanelle Bal at [facility] advised that referral needed to be faxed to [phone]. Referral faxed. Hospice currently has no beds and patient will likely be GIP.    Nealy Hickmon, Clydene Pugh, LCSW

## 2019-08-06 NOTE — Clinical Social Work Note (Signed)
Patient Information  Patient Name  Andrew Roman, Andrew Roman ([id number]) Sex  Male DOB  11-13-1946  Room Bed  A320 A320-01  Patient Demographics  Address  [address] [place]  [place] [address] Phone  [phone] (Home)  Patient Ethnicity & Race  Ethnic Group Patient Race  Not Hispanic or [place]  Emergency Contact(s)  Name Relation Home Work Watchung, richard Son   [phone]  Documents on File   Status Date Received Description  Documents for the Patient  [place] Not Received    [place] E-Signature HIPAA Notice of Privacy Signed 12/28/13   Driver's License Not Received    Insurance Card Not Received    Other Photo ID Not Received    HIM ROI Authorization Received 08/02/19 [facility]  HIM Release of Information Output  08/02/19 Requested records  Documents for the Encounter  AOB (Assignment of Insurance Benefits) Not Received    E-signature AOB Signed 08/05/19   MEDICARE RIGHTS Not Received    E-signature Medicare Rights Signed 08/05/19   After Visit Summary   ED After Visit Summary  ED Patient Billing Extract   ED PB Billing Extract  Cardiac Monitoring Strip Received 08/05/19   Admission Information  Current Information  Attending Provider Admitting Provider Admission Type Admission Status  Murlean Iba, MD [place], Silver [place], MD Emergency Admission (Confirmed)       Admission Date/Time Discharge Date Hospital Service Auth/Cert Status  52/08/02 10:04 AM  Internal Medicine [place] Unit Room/Bed   [facility] AP-DEPT 300 A320/A320-01        Admission  Complaint  [facility] Account  Name Acct ID Class Status Primary Coverage  Bralyn, [place] [id number] Hospice inpatient [employer] [place] HMO      Guarantor Account (for Hospital Account [id number])  Name Relation to [place]?  Acct Type  Lonzo Candy Self [facility] Yes Personal/Family  Address Phone    [address] [place]  [place], [place] [address] [phone])        Coverage Information (for Hospital Account [id number])  F/O Payor/Plan Precert #  [facility] #  Naszir, Cott [id number]  [place] [address] [place] [address] [phone]       Care Everywhere ID:  [id number]

## 2019-08-07 NOTE — Progress Notes (Signed)
PROGRESS NOTE Andrew Roman  GEX:[id number]  DOB: [birth year]  DOA: 08/05/2019 PCP: [facility]  Brief Admission Hx: 73 y.o. male, with past medical history significant for hypertension, diabetes mellitus, chronic respiratory failure on 5 L nasal cannula at baseline, interstitial lung disease, recent diagnosis of metastatic lung malignancy, was recently hospitalized secondary to encephalopathy and now presents with uncontrolled pain and family not able to care for at home and requesting direct placement to [facility].     MDM/Assessment & Plan:   1. Metastatic lung cancer - Pt not deemed medically stable for aggressive treatments per his oncologist and family now requesting placement in hospice facility and are unable to manage his care at home and brought him back to the hospital after being discharged 2 days ago.  I have asked for social worker to make a referral to residential hospice.  His prognosis remains poor and anticipate that he may expire in 2 weeks or less given the advanced metastatic nature of his disease. Will focus on comfort and dignity.    Code Status: DNR  Disposition Plan: residential hospice when bed available, currently no beds available.   Subjective: Pt reports he wants to eat breakfast but has no other complaints or concerns.   Objective: Vitals:   08/06/19 1959 08/07/19 0610 08/07/19 0611 08/07/19 0855  BP: (!) 98/55 137/74  126/70  Pulse: (!) 59 74  76  Resp: (!) 22 18    Temp: 97.7 F (36.5 C) (!) 97.5 F (36.4 C) 98.3 F (36.8 C)   TempSrc: Oral Axillary    SpO2: 98% 100%    Weight:      Height:        Intake/Output Summary (Last 24 hours) at 08/07/2019 1113 Last data filed at 08/07/2019 0900 Gross per 24 hour  Intake 360 ml  Output 1700 ml  Net -1340 ml   Filed Weights   08/05/19 1010 08/05/19 2051  Weight: 83.9 kg 86.9 kg   REVIEW OF SYSTEMS  As per history otherwise all reviewed and reported negative  Exam:   General exam: chronically ill appearing male, awake, alert, NAD. Cooperative.  Respiratory system: shallow BS bilateral.  Cardiovascular system: S1 & S2 heard. No JVD, murmurs, gallops, clicks or pedal edema. Gastrointestinal system: Abdomen is nondistended, soft and nontender. Normal bowel sounds heard. Central nervous system: Alert and oriented. No focal neurological deficits. Extremities: no CCE.  Data Reviewed: Basic Metabolic Panel: Recent Labs  Lab 08/03/19 1111 08/04/19 0443 08/05/19 1040  NA 136 137 136  K 4.1 4.5 4.0  CL 94* 98 97*  CO2 29 28 28   GLUCOSE 111* 110* 132*  BUN 10 12 14   CREATININE 1.01 0.95 0.94  CALCIUM 9.2 8.8* 9.0   Liver Function Tests: Recent Labs  Lab 08/03/19 1111 08/05/19 1040  AST 14* 13*  ALT 14 11  ALKPHOS 68 59  BILITOT 1.4* 0.6  PROT 7.5 7.1  ALBUMIN 3.2* 3.0*   Recent Labs  Lab 08/03/19 1111 08/05/19 1040  LIPASE 20 17   No results for input(s): AMMONIA in the last 168 hours. CBC: Recent Labs  Lab 08/03/19 1111 08/04/19 0443 08/05/19 1040  WBC 19.9* 22.3* 21.7*  NEUTROABS 13.9*  --  16.5*  HGB 14.2 13.0 13.2  HCT 46.8 42.5 42.9  MCV 90.5 91.0 90.3  PLT 323 300 301   Cardiac Enzymes: No results for input(s): CKTOTAL, CKMB, CKMBINDEX, TROPONINI in the last 168 hours. CBG (last 3)  No results for input(s):  GLUCAP in the last 72 hours. Recent Results (from the past 240 hour(s))  Urine culture     Status: None   Collection Time: 08/03/19 11:12 AM   Specimen: Urine, Clean Catch  Result Value Ref Range Status   Specimen Description   Final    URINE, CLEAN CATCH Performed at [facility], [address] [place], [place] [address]    Special Requests   Final    Normal Performed at [facility], [address] [place], [place] [address]    Culture   Final    Multiple bacterial morphotypes present, none predominant. Suggest appropriate recollection if clinically indicated.   Report Status 08/04/2019 FINAL  Final   SARS Coronavirus 2 [facility] order, Performed in [facility] hospital lab) Nasopharyngeal Nasopharyngeal Swab     Status: None   Collection Time: 08/03/19 11:13 AM   Specimen: Nasopharyngeal Swab  Result Value Ref Range Status   SARS Coronavirus 2 NEGATIVE NEGATIVE Final    Comment: (NOTE) If result is NEGATIVE SARS-CoV-2 target nucleic acids are NOT DETECTED. The SARS-CoV-2 RNA is generally detectable in upper and lower  respiratory specimens during the acute phase of infection. The lowest  concentration of SARS-CoV-2 viral copies this assay can detect is 250  copies / mL. A negative result does not preclude SARS-CoV-2 infection  and should not be used as the sole basis for treatment or other  patient management decisions.  A negative result may occur with  improper specimen collection / handling, submission of specimen other  than nasopharyngeal swab, presence of viral mutation(s) within the  areas targeted by this assay, and inadequate number of viral copies  (<250 copies / mL). A negative result must be combined with clinical  observations, patient history, and epidemiological information. If result is POSITIVE SARS-CoV-2 target nucleic acids are DETECTED. The SARS-CoV-2 RNA is generally detectable in upper and lower  respiratory specimens dur ing the acute phase of infection.  Positive  results are indicative of active infection with SARS-CoV-2.  Clinical  correlation with patient history and other diagnostic information is  necessary to determine patient infection status.  Positive results do  not rule out bacterial infection or co-infection with other viruses. If result is PRESUMPTIVE POSTIVE SARS-CoV-2 nucleic acids MAY BE PRESENT.   A presumptive positive result was obtained on the submitted specimen  and confirmed on repeat testing.  While 2019 novel coronavirus  (SARS-CoV-2) nucleic acids may be present in the submitted sample  additional confirmatory testing may be  necessary for epidemiological  and / or clinical management purposes  to differentiate between  SARS-CoV-2 and other Sarbecovirus currently known to infect humans.  If clinically indicated additional testing with an alternate test  methodology [phone]) is advised. The SARS-CoV-2 RNA is generally  detectable in upper and lower respiratory sp ecimens during the acute  phase of infection. The expected result is Negative. Fact Sheet for Patients:  StrictlyIdeas.no Fact Sheet for Healthcare Providers: BankingDealers.co.za This test is not yet approved or cleared by the [place] FDA and has been authorized for detection and/or diagnosis of SARS-CoV-2 by FDA under an Emergency Use Authorization (EUA).  This EUA will remain in effect (meaning this test can be used) for the duration of the COVID-19 declaration under Section 564(b)(1) of the Act, 21 U.S.C. section 360bbb-3(b)(1), unless the authorization is terminated or revoked sooner. Performed at [facility], [address] [place], [place] [address]   Culture, blood (routine x 2)     Status:  None (Preliminary result)   Collection Time: 08/03/19 11:45 AM   Specimen: Right Antecubital; Blood  Result Value Ref Range Status   Specimen Description RIGHT ANTECUBITAL  Final   Special Requests   Final    BOTTLES DRAWN AEROBIC AND ANAEROBIC Blood Culture adequate volume   Culture   Final    NO GROWTH 4 DAYS Performed at [facility], [address], [place], [place] [address]    Report Status PENDING  Incomplete  Culture, blood (routine x 2)     Status: None (Preliminary result)   Collection Time: 08/03/19 11:54 AM   Specimen: BLOOD LEFT FOREARM  Result Value Ref Range Status   Specimen Description BLOOD LEFT FOREARM  Final   Special Requests   Final    BOTTLES DRAWN AEROBIC AND ANAEROBIC Blood Culture adequate volume   Culture   Final    NO GROWTH 4 DAYS Performed at [facility],  [address] [place], [place] [address]    Report Status PENDING  Incomplete  SARS Coronavirus 2 [facility] order, Performed in [place] hospital lab) Nasopharyngeal Nasopharyngeal Swab     Status: None   Collection Time: 08/05/19  7:55 PM   Specimen: Nasopharyngeal Swab  Result Value Ref Range Status   SARS Coronavirus 2 NEGATIVE NEGATIVE Final    Comment: (NOTE) If result is NEGATIVE SARS-CoV-2 target nucleic acids are NOT DETECTED. The SARS-CoV-2 RNA is generally detectable in upper and lower  respiratory specimens during the acute phase of infection. The lowest  concentration of SARS-CoV-2 viral copies this assay can detect is 250  copies / mL. A negative result does not preclude SARS-CoV-2 infection  and should not be used as the sole basis for treatment or other  patient management decisions.  A negative result may occur with  improper specimen collection / handling, submission of specimen other  than nasopharyngeal swab, presence of viral mutation(s) within the  areas targeted by this assay, and inadequate number of viral copies  (<250 copies / mL). A negative result must be combined with clinical  observations, patient history, and epidemiological information. If result is POSITIVE SARS-CoV-2 target nucleic acids are DETECTED. The SARS-CoV-2 RNA is generally detectable in upper and lower  respiratory specimens dur ing the acute phase of infection.  Positive  results are indicative of active infection with SARS-CoV-2.  Clinical  correlation with patient history and other diagnostic information is  necessary to determine patient infection status.  Positive results do  not rule out bacterial infection or co-infection with other viruses. If result is PRESUMPTIVE POSTIVE SARS-CoV-2 nucleic acids MAY BE PRESENT.   A presumptive positive result was obtained on the submitted specimen  and confirmed on repeat testing.  While 2019 novel coronavirus  (SARS-CoV-2) nucleic acids may be  present in the submitted sample  additional confirmatory testing may be necessary for epidemiological  and / or clinical management purposes  to differentiate between  SARS-CoV-2 and other Sarbecovirus currently known to infect humans.  If clinically indicated additional testing with an alternate test  methodology [phone]) is advised. The SARS-CoV-2 RNA is generally  detectable in upper and lower respiratory sp ecimens during the acute  phase of infection. The expected result is Negative. Fact Sheet for Patients:  StrictlyIdeas.no Fact Sheet for Healthcare Providers: BankingDealers.co.za This test is not yet approved or cleared by the [place] FDA and has been authorized for detection and/or diagnosis of SARS-CoV-2 by FDA under an Emergency Use Authorization (EUA).  This EUA will  remain in effect (meaning this test can be used) for the duration of the COVID-19 declaration under Section 564(b)(1) of the Act, 21 U.S.C. section 360bbb-3(b)(1), unless the authorization is terminated or revoked sooner. Performed at [facility], [address], [place], [place] [address]      Studies: No results found. Scheduled Meds: . atenolol  12.5 mg Oral Daily  . influenza vaccine adjuvanted  0.5 mL Intramuscular Tomorrow-1000  . senna-docusate  3 tablet Oral BID   Continuous Infusions:  Active Problems:   Metastatic cancer (HCC)   COPD (chronic obstructive pulmonary disease) (HCC)   Diabetes mellitus without complication (HCC)   Chronic respiratory failure with hypoxia (HCC)   Uncontrolled pain   Metastatic lung cancer (metastasis from lung to other site) [facility])   Comfort measures only status  Time spent:   Irwin Brakeman, MD [facility] 08/07/2019, 11:13 AM    LOS: 1 day  How to contact the [facility] Attending or Consulting provider [place] or covering provider during after hours [place], for this patient?  1. Check the care team  in [facility] and look for a) attending/consulting TRH provider listed and b) the [facility] team listed 2. Log into [email or web address] and use [place]'s universal password to access. If you do not have the password, please contact the hospital operator. 3. Locate the [facility] provider you are looking for under [facility] and page to a number that you can be directly reached. 4. If you still have difficulty reaching the provider, please page the [facility] (Director on Call) for the Hospitalists listed on amion for assistance.

## 2019-08-08 ENCOUNTER — Encounter (HOSPITAL_COMMUNITY): Payer: Self-pay | Admitting: Family Medicine

## 2019-08-08 DIAGNOSIS — I10 Essential (primary) hypertension: Secondary | ICD-10-CM | POA: Diagnosis present

## 2019-08-08 DIAGNOSIS — J449 Chronic obstructive pulmonary disease, unspecified: Secondary | ICD-10-CM | POA: Diagnosis present

## 2019-08-08 DIAGNOSIS — Z96652 Presence of left artificial knee joint: Secondary | ICD-10-CM | POA: Diagnosis present

## 2019-08-08 DIAGNOSIS — Z515 Encounter for palliative care: Secondary | ICD-10-CM

## 2019-08-08 DIAGNOSIS — E119 Type 2 diabetes mellitus without complications: Secondary | ICD-10-CM | POA: Diagnosis present

## 2019-08-08 DIAGNOSIS — Z794 Long term (current) use of insulin: Secondary | ICD-10-CM

## 2019-08-08 DIAGNOSIS — G893 Neoplasm related pain (acute) (chronic): Principal | ICD-10-CM | POA: Diagnosis present

## 2019-08-08 DIAGNOSIS — Z66 Do not resuscitate: Secondary | ICD-10-CM | POA: Diagnosis present

## 2019-08-08 DIAGNOSIS — J849 Interstitial pulmonary disease, unspecified: Secondary | ICD-10-CM | POA: Diagnosis present

## 2019-08-08 DIAGNOSIS — J9610 Chronic respiratory failure, unspecified whether with hypoxia or hypercapnia: Secondary | ICD-10-CM | POA: Diagnosis present

## 2019-08-08 DIAGNOSIS — Z7952 Long term (current) use of systemic steroids: Secondary | ICD-10-CM

## 2019-08-08 DIAGNOSIS — C799 Secondary malignant neoplasm of unspecified site: Secondary | ICD-10-CM | POA: Diagnosis present

## 2019-08-08 DIAGNOSIS — Z885 Allergy status to narcotic agent status: Secondary | ICD-10-CM

## 2019-08-08 DIAGNOSIS — Z79899 Other long term (current) drug therapy: Secondary | ICD-10-CM

## 2019-08-08 DIAGNOSIS — C3431 Malignant neoplasm of lower lobe, right bronchus or lung: Secondary | ICD-10-CM | POA: Diagnosis present

## 2019-08-08 DIAGNOSIS — Z7982 Long term (current) use of aspirin: Secondary | ICD-10-CM

## 2019-08-08 LAB — CULTURE, BLOOD (ROUTINE X 2)
Culture: NO GROWTH
Culture: NO GROWTH
Special Requests: ADEQUATE
Special Requests: ADEQUATE

## 2019-08-08 MED ORDER — LORAZEPAM 2 MG/ML IJ SOLN: 1.0000 mg | INTRAMUSCULAR | Status: DC | PRN

## 2019-08-08 MED ORDER — OXYCODONE HCL 5 MG PO TABS
5.0000 mg | ORAL_TABLET | ORAL | Status: DC | PRN
  Administered 2019-08-08 – 2019-08-09 (×3): 5 mg via ORAL
  Filled 2019-08-08 (×3): qty 1

## 2019-08-08 MED ORDER — HALOPERIDOL 0.5 MG PO TABS: 0.5000 mg | ORAL_TABLET | ORAL | Status: DC | PRN

## 2019-08-08 MED ORDER — HYDROMORPHONE HCL 1 MG/ML IJ SOLN: 0.5000 mg | INTRAMUSCULAR | Status: DC | PRN

## 2019-08-08 MED ORDER — ONDANSETRON HCL 4 MG/2ML IJ SOLN: 4.0000 mg | Freq: Four times a day (QID) | INTRAMUSCULAR | Status: DC | PRN

## 2019-08-08 MED ORDER — GLYCOPYRROLATE 0.2 MG/ML IJ SOLN: 0.2000 mg | INTRAMUSCULAR | Status: DC | PRN

## 2019-08-08 MED ORDER — ONDANSETRON 4 MG PO TBDP: 4.0000 mg | ORAL_TABLET | Freq: Four times a day (QID) | ORAL | Status: DC | PRN

## 2019-08-08 MED ORDER — POLYVINYL ALCOHOL 1.4 % OP SOLN: 1.0000 [drp] | Freq: Four times a day (QID) | OPHTHALMIC | Status: DC | PRN

## 2019-08-08 MED ORDER — GLYCOPYRROLATE 1 MG PO TABS: 1.0000 mg | ORAL_TABLET | ORAL | Status: DC | PRN

## 2019-08-08 MED ORDER — LORAZEPAM 1 MG PO TABS: 1.0000 mg | ORAL_TABLET | ORAL | Status: DC | PRN

## 2019-08-08 MED ORDER — HALOPERIDOL LACTATE 2 MG/ML PO CONC
0.5000 mg | ORAL | Status: DC | PRN
  Filled 2019-08-08: qty 0.3

## 2019-08-08 MED ORDER — LORAZEPAM 2 MG/ML PO CONC: 1.0000 mg | ORAL | Status: DC | PRN

## 2019-08-08 MED ORDER — BISACODYL 10 MG RE SUPP: 10.0000 mg | Freq: Every day | RECTAL | Status: DC | PRN

## 2019-08-08 MED ORDER — ACETAMINOPHEN 650 MG RE SUPP: 650.0000 mg | Freq: Four times a day (QID) | RECTAL | Status: DC | PRN

## 2019-08-08 MED ORDER — HALOPERIDOL LACTATE 5 MG/ML IJ SOLN: 0.5000 mg | INTRAMUSCULAR | Status: DC | PRN

## 2019-08-08 MED ORDER — BIOTENE DRY MOUTH MT LIQD: 15.0000 mL | OROMUCOSAL | Status: DC | PRN

## 2019-08-08 MED ORDER — ACETAMINOPHEN 325 MG PO TABS: 650.0000 mg | ORAL_TABLET | Freq: Four times a day (QID) | ORAL | Status: DC | PRN

## 2019-08-08 MED ORDER — DIPHENHYDRAMINE HCL 50 MG/ML IJ SOLN: 12.5000 mg | INTRAMUSCULAR | Status: DC | PRN

## 2019-08-08 NOTE — H&P (Signed)
History and Physical  Ferd Horrigan KYH:[id number] DOB: 01/30/1946 DOA: 08/08/2019  [facility]P: [facility]   HPI: Andrew Roman is a 73 y.o. malewith past medical history significant for hypertension, diabetes mellitus, chronic respiratory failure on 5 L nasal cannula at baseline, interstitial lung disease, recent diagnosis of metastatic lung malignancy, was recently hospitalized secondary to encephalopathy and now presents with uncontrolled pain and family not able to care for at home and requesting direct placement to [facility]  Review of Systems: UTO due to current condition  Past Medical History:  Diagnosis Date  . Cancer (HCC)    lung mass  . COPD (chronic obstructive pulmonary disease) (HCC)    lung disease  . Diabetes mellitus without complication ([place])   . Hypertension    Past Surgical History:  Procedure Laterality Date  . REPLACEMENT TOTAL KNEE Left    Social History:  reports that he has never smoked. He has never used smokeless tobacco. He reports that he does not drink alcohol or use drugs.  Allergies  Allergen Reactions  . Morphine And Related     No family history on file.  Prior to Admission medications   Medication Sig Start Date End Date Taking? Authorizing Provider  aspirin EC 81 MG tablet Take 81 mg by mouth daily.    [provider]  atenolol (TENORMIN) 25 MG tablet Take 12.5 mg by mouth daily.     [provider]  docusate sodium (COLACE) 100 MG capsule Take 1 capsule (100 mg total) by mouth 2 (two) times daily. 08/04/19 08/03/20  Barton Dubois, MD  HYDROcodone-Acetaminophen 10-300 MG TABS Take 1 tablet by mouth every 8 (eight) hours as needed for up to 14 days (severe pain). 08/04/19 08/18/19  Barton Dubois, MD  insulin NPH-regular Human (70-30) 100 UNIT/ML injection Inject 8 Units into the skin 2 (two) times daily with a meal.    [provider]  Multiple Vitamin (MULTIVITAMIN) tablet Take 1 tablet by mouth  daily.    [provider]  omeprazole (PRILOSEC) 40 MG capsule Take 40 mg by mouth daily.    [provider]  ondansetron (ZOFRAN ODT) 4 MG disintegrating tablet Take 1 tablet (4 mg total) by mouth every 8 (eight) hours as needed for nausea. 08/05/19   Long, Wonda Olds, MD  polyethylene glycol (MIRALAX) 17 g packet Take 17 g by mouth daily as needed. 08/04/19   Barton Dubois, MD  predniSONE (DELTASONE) 5 MG tablet Take 5 mg by mouth daily with breakfast.    [provider]  simvastatin (ZOCOR) 10 MG tablet Take 10 mg by mouth at bedtime.    [provider]   Physical Exam: There were no vitals filed for this visit.   General exam: Pt is resting comfortably and in no distress.   Labs on Admission:  Basic Metabolic Panel: Recent Labs  Lab 08/03/19 1111 08/04/19 0443 08/05/19 1040  NA 136 137 136  K 4.1 4.5 4.0  CL 94* 98 97*  CO2 29 28 28   GLUCOSE 111* 110* 132*  BUN 10 12 14   CREATININE 1.01 0.95 0.94  CALCIUM 9.2 8.8* 9.0   Liver Function Tests: Recent Labs  Lab 08/03/19 1111 08/05/19 1040  AST 14* 13*  ALT 14 11  ALKPHOS 68 59  BILITOT 1.4* 0.6  PROT 7.5 7.1  ALBUMIN 3.2* 3.0*   Recent Labs  Lab 08/03/19 1111 08/05/19 1040  LIPASE 20 17   No results for input(s): AMMONIA in the last  168 hours. CBC: Recent Labs  Lab 08/03/19 1111 08/04/19 0443 08/05/19 1040  WBC 19.9* 22.3* 21.7*  NEUTROABS 13.9*  --  16.5*  HGB 14.2 13.0 13.2  HCT 46.8 42.5 42.9  MCV 90.5 91.0 90.3  PLT 323 300 301   Cardiac Enzymes: No results for input(s): CKTOTAL, CKMB, CKMBINDEX, TROPONINI in the last 168 hours.  BNP (last 3 results) No results for input(s): PROBNP in the last 8760 hours. CBG: Recent Labs  Lab 08/03/19 2050 08/04/19 0722 08/04/19 1110  GLUCAP 97 86 144*   Radiological Exams on Admission: No results found.  Assessment/Plan Principal Problem:   Metastatic cancer ([place]) Active Problems:   Comfort measures only status    1. Metastatic lung cancer - Pt not deemed medically stable for aggressive treatments per his oncologist and family now requesting placement in hospice facility and are unable to manage his care at home and brought him back to the hospital after being discharged 2 days ago.  I have asked for social worker to make a referral to residential hospice.  His prognosis remains poor and anticipate that he may expire in 2 weeks or less given the advanced metastatic nature of his disease. Will focus on comfort and dignity.   Code Status: DNR  Disposition Plan: residential hospice when bed available, currently no beds available.   Irwin Brakeman, MD [facility] How to contact the [facility] Attending or Consulting provider [place] or covering provider during after hours [place], for this patient?  1. Check the care team in [facility] and look for a) attending/consulting TR[facility] provider listed and b) the [facility] team listed 2. Log into [email or web address] and use [place]'s universal password to access. If you do not have the password, please contact the hospital operator. 3. Locate the [facility] provider you are looking for under [facility] and page to a number that you can be directly reached. 4. If you still have difficulty reaching the provider, please page the [facility] (Director on Call) for the Hospitalists listed on amion for assistance.

## 2019-08-08 NOTE — Discharge Summary (Signed)
Physician Discharge Summary  Desmon Hitchner SEG:[id number] DOB: Aug 14, 1946 DOA: 08/05/2019  PCP: [facility]  Admit date: 08/05/2019 Discharge date: 08/08/2019  Admitted From: Home  Disposition: Hospice  Discharge Condition: HOSPICE  CODE STATUS: DNR    Brief Hospitalization Summary: Please see all hospital notes, images, labs for full details of the hospitalization.  Brief Admission Hx: 73 y.o.male,with past medical history significant for hypertension, diabetes mellitus, chronic respiratory failure on 5 L nasal cannula at baseline, interstitial lung disease, recent diagnosis of metastatic lung malignancy, was recently hospitalized secondary to encephalopathy and now presents with uncontrolled pain and family not able to care for at home and requesting direct placement to [facility].     MDM/Assessment & Plan:   1. Metastatic lung cancer - Pt not deemed medically stable for aggressive treatments per his oncologist and family now requesting placement in hospice facility and are unable to manage his care at home and brought him back to the hospital after being discharged 2 days ago.  I have asked for social worker to make a referral to residential hospice.  His prognosis remains poor and anticipate that he may expire in 2 weeks or less given the advanced metastatic nature of his disease. Will focus on comfort and dignity.    Code Status: DNR  Disposition Plan: residential hospice when bed available, currently no beds available.  Discharge Diagnoses:  Active Problems:   Metastatic cancer (HCC)   COPD (chronic obstructive pulmonary disease) (HCC)   Diabetes mellitus without complication (HCC)   Chronic respiratory failure with hypoxia (HCC)   Uncontrolled pain   Metastatic lung cancer (metastasis from lung to other site) [facility])   Comfort measures only status  Discharge Instructions: Discharge Instructions    Ambulatory referral to Hospice   Complete by: As  directed      Allergies as of 08/08/2019      Reactions   Morphine And Related      Follow-up Information    Call  [facility].   Why: to confirm your home health services Contact information: [address] [address] [phone]        [facility] Call today.   Why: to confirm your follow up appointment Contact information: [place] [address] [phone]        [facility].   Specialty: Emergency Medicine Why: If symptoms worsen Contact information: [address] [phone] Prudy Feeler [place] [address] [phone]         Allergies  Allergen Reactions  . Morphine And Related    Allergies as of 08/08/2019      Reactions   Morphine And Related      Procedures/Studies: Ct Head W & Wo Contrast  Result Date: 08/04/2019 CLINICAL DATA:  Metastatic carcinoma. EXAM: CT HEAD WITHOUT AND WITH CONTRAST TECHNIQUE: Contiguous axial images were obtained from the base of the skull through the vertex without and with intravenous contrast CONTRAST:  72mL OMNIPAQUE IOHEXOL 300 MG/ML  SOLN COMPARISON:  None. FINDINGS: Brain: Mild generalized atrophy. Chronic small-vessel ischemic changes of the cerebral hemispheric white matter. No sign of recent infarction, intra-axial mass lesion, hemorrhage, hydrocephalus or extra-axial collection. No abnormal enhancement occurs. Vascular: There is atherosclerotic calcification of the major vessels at the base of the brain. Skull: Negative Sinuses/Orbits: Clear/normal Other: None IMPRESSION: No evidence of metastatic disease to the brain, leptomeninges or skull. Atrophy and chronic small-vessel ischemic change of the white matter. Electronically Signed  By: Nelson Chimes M.D.   On: 08/04/2019 15:07   Ct Angio Chest Pe W And/or Wo Contrast  Result Date: 08/03/2019 CLINICAL DATA:  Right-sided chest pain. EXAM: CT ANGIOGRAPHY CHEST WITH  CONTRAST TECHNIQUE: Multidetector CT imaging of the chest was performed using the standard protocol during bolus administration of intravenous contrast. Multiplanar CT image reconstructions and MIPs were obtained to evaluate the vascular anatomy. CONTRAST:  148mL OMNIPAQUE IOHEXOL 350 MG/ML SOLN COMPARISON:  Chest x-ray dated 08/03/2019 FINDINGS: Cardiovascular: There are no pulmonary emboli. However, there is a posterior mediastinal mass at the level of the left atrium which invades the left atrium. There is a 3 x 2 x 2 cm irregular mass in the posterior aspect of the left atrium representing a direct extension of the tumor in the posterior mediastinum into the atrium. Overall heart size is normal. No pericardial effusion. Slight coronary artery calcifications. Mediastinum/Nodes: There is right hilar and subcarinal adenopathy. There is a 6.6 x 6.6 x 4.4 cm posterior mediastinal mass immediately posterior to the left atrium extending to the diaphragmatic hiatus immediately adjacent to the esophagus. The fat planes between the distal esophagus and this mass are obscured. The thyroid gland and trachea appear normal. Lungs/Pleura: The mass in the posterior mediastinum extends into the left hilum. There is narrowing of the right middle lobe and lower lobe bronchi. There is peripheral interstitial disease throughout both lungs, most prominent posteriorly at the right lung base. No discrete effusions. Upper Abdomen: There is adenopathy around the porta hepatis and around the inferior vena cava just below the liver. Musculoskeletal: There is a slight anterior wedge deformity of there is also a 9 mm lytic lesion in the superior aspect of T7, likely a benign Schmorl's node. PET scan does show metastatic disease in the sclerotic left pedicle of T7. There is a destructive expansile lesion of the anterior aspect of the right third rib. The PET scan demonstrated numerous metastatic lesions in the ribs and spine as well as in the  scapulae. There is a subtle lytic lesion in the posteromedial aspect of the left ninth rib. Review of the MIP images confirms the above findings. IMPRESSION: 1. No pulmonary emboli. 2. Large mass in the posterior mediastinum extending into the left hilum with invasion of the left atrium. 3. Right hilar and subcarinal adenopathy consistent with metastatic disease. 4. Metastatic disease to the bones as described. 5. Probable benign Schmorl's node in the superior aspect of the T7 vertebral body. Electronically Signed   By: Lorriane Shire M.D.   On: 08/03/2019 15:37   Ct Abdomen Pelvis W Contrast  Result Date: 08/03/2019 CLINICAL DATA:  Acute generalized abdominal pain. Foot EXAM: CT ABDOMEN AND PELVIS WITH CONTRAST TECHNIQUE: Multidetector CT imaging of the abdomen and pelvis was performed using the standard protocol following bolus administration of intravenous contrast. CONTRAST:  114mL OMNIPAQUE IOHEXOL 350 MG/ML SOLN COMPARISON:  None. FINDINGS: Lower chest: Posterior mediastinal mass invading the left atrium. Adenopathy adjacent to the distal esophagus just above the diaphragmatic hiatus. Hepatobiliary: There are 3 discrete metastases in the liver which correlate with the PET-CT scan. There is a 2 cm mass in the posterior aspect of the dome of the right lobe of the liver. There is a 17 mm lesion in the periphery of the right lobe and there is a 16 mm lesion in the inferior aspect of the left lobe of the liver. There is a single calcified gallstone. No dilated bile ducts. Prominent caudate lobe. Pancreas: Normal. Spleen:  Normal. 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 Left adrenal gland appears normal. Bilateral renal cysts as demonstrated on the prior PET-CT scan. No hydronephrosis. Bladder appears normal. Stomach/Bowel: Stomach is within normal limits. Appendix appears normal. No evidence of bowel wall thickening, distention, or inflammatory changes. Multiple diverticula in the distal colon.  The appendix is not discretely identified. Vascular/Lymphatic: Aortic atherosclerosis. Multiple enlarged lymph nodes in the porta hepatis and adjacent to the celiac axis. Reproductive: Prostate is unremarkable. Other: No abdominal wall hernia or abnormality. No abdominopelvic ascites. Musculoskeletal: The multiple metastatic lesions in the spine, pelvic bones, and proximal left femur are quite subtle on the CT scan. While the proximal left femur lesions has a vague area of increased density best seen on image 102 of series 5. No discrete destructive bone lesions. IMPRESSION: 1. 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 2. Multiple enlarged lymph nodes in the porta hepatis and adjacent to the celiac axis. 3. Multiple metastatic lesions in the spine, pelvic bones, and proximal left femur. 4. No acute abnormalities in the abdomen as compared to the prior PET-CT. Aortic Atherosclerosis (ICD10-I70.0). Electronically Signed   By: Lorriane Shire M.D.   On: 08/03/2019 15:47   Nm Pet Image Initial (pi) Skull Base To Thigh  Result Date: 08/01/2019 CLINICAL DATA:  Initial treatment strategy for right lower lobe lung malignancy. EXAM: NUCLEAR MEDICINE PET SKULL BASE TO THIGH TECHNIQUE: 12.2 mCi F-18 FDG was injected intravenously. Full-ring PET imaging was performed from the skull base to thigh after the radiotracer. CT data was obtained and used for attenuation correction and anatomic localization. Fasting blood glucose: 110 mg/dl COMPARISON:  None. FINDINGS: Mediastinal blood pool activity: SUV max 3.3 Liver activity: SUV max NA NECK: Hypermetabolic mildly enlarged bilateral level II neck lymph nodes, for example measuring 1.0 cm on the left with max SUV 4.8 (series 3/image 36). Small focus of hypermetabolism in the right submandibular musculature with max SUV 7.0, without discrete CT correlate. Incidental CT findings: none CHEST: Hypermetabolic solid 5.7 x 4.8 cm lung mass in the paramediastinal  right lower lobe with probable direct mediastinal invasion with max SUV 16.4 (series 3/image 88). There is patchy irregular thickening of peribronchovascular interstitium throughout both lungs without additional foci of pulmonary hypermetabolism. Hypermetabolic 0.7 cm right axillary node with max SUV 3.9 (series 3/image 58). Hypermetabolic enlarged 1.3 cm subcarinal node with max SUV 7.7 (series 3/image 75). No hypermetabolic left axillary or hilar nodes. Hypermetabolic 1.1 cm soft tissue nodule posterior to the right pectoralis musculature with max SUV 5.3 (series 3/image 95). Incidental CT findings: Coronary atherosclerosis. Mildly atherosclerotic nonaneurysmal thoracic aorta. ABDOMEN/PELVIS: Three hypermetabolic liver lesions scattered in the liver, including a 1.3 cm posterior right liver lesion with max SUV 9.4 (series 3/image 100), a posterior right liver dome 1.3 cm lesion with max SUV 7.6 (series 3/image 87) and a 1.1 cm segment 3 left liver lobe lesion with max SUV 7.7 (series 3/image 116). Hypermetabolic 2.4 cm right adrenal nodule with max SUV 15.7 (series 3/image 117). No hypermetabolic left adrenal nodules. Two hypermetabolic soft tissue implants in the omentum, largest 1.4 cm in the right omentum with max SUV 8.4 (series 3/image 159). Hypermetabolic high right retroperitoneal 1.2 cm lymph node with max SUV 4.9 (series 3/image 117). No additional hypermetabolic abdominopelvic nodes. Bilateral medial gluteal musculature soft tissue foci of hypermetabolism, largest 1.1 cm on the left with max SUV 7.4 (series 3/image 253). No abnormal hypermetabolic activity within the pancreas or spleen. Incidental CT  findings: Cholelithiasis. Atherosclerotic nonaneurysmal abdominal aorta. Punctate nonobstructing interpolar right renal stone. Mild sigmoid diverticulosis. SKELETON: Numerous hypermetabolic osseous lesions scattered throughout the axial and proximal appendicular skeleton, which are relatively CT occult.  Representative lesions as follows: -inferior right scapular lesion with max SUV 11.6 -anterior left third rib lesion with max SUV 13.5 -proximal right humeral metaphysis lesion with max SUV 10.6 -right iliac crest lesion with max SUV 8.6 -left femoral neck lesion with max SUV 9.2 -L1 vertebral lesion with max SUV 13.4 Incidental CT findings: none IMPRESSION: 1. Intensely hypermetabolic (max SUV 61.4) 5.7 cm lung mass in the paramediastinal right lower lobe, compatible with primary bronchogenic carcinoma, with probable direct mediastinal invasion. 2. Hypermetabolic subcarinal lymphadenopathy. 3. Irregular patchy thickening of the peribronchovascular interstitium throughout both lungs, nonspecific, lymphangitic carcinomatosis not excluded. 4. Hypermetabolic distant lymphadenopathy in the bilateral level II neck, right axilla and high right retroperitoneum. 5. Multiple hypermetabolic liver metastases. 6. Hypermetabolic right adrenal metastasis. 7. Hypermetabolic omental metastases. 8. Scattered hypermetabolic soft tissue metastases in right chest wall and bilateral gluteal musculature. 9. Widespread hypermetabolic osseous metastatic disease throughout the axial and proximal appendicular skeleton. Hypermetabolic left femoral neck metastasis places the patient at risk for pathologic fracture. 10. PET/CT staging is stage IVB (T4 N2 M1c). 11. Chronic findings include: Aortic Atherosclerosis (ICD10-I70.0). Coronary atherosclerosis. Cholelithiasis. Mild sigmoid diverticulosis. Electronically Signed   By: Ilona Sorrel M.D.   On: 08/01/2019 22:18   Dg Chest Portable 1 View  Result Date: 08/03/2019 CLINICAL DATA:  Chest pain. EXAM: PORTABLE CHEST 1 VIEW COMPARISON:  None. FINDINGS: Mild cardiomegaly is noted. Hypoinflation of the lungs is noted. No pneumothorax is noted. Small pleural effusions may be present. Bilateral lung opacities are noted concerning for possible edema or inflammation. Bony thorax is unremarkable.  IMPRESSION: Hypoinflation of the lungs is noted. Small pleural effusions may be present. Bilateral interstitial lung opacities are noted which may represent edema or inflammation. Electronically Signed   By: Marijo Conception M.D.   On: 08/03/2019 11:40     Subjective: Pt appears comfortable.   Discharge Exam: Vitals:   08/07/19 2153 08/08/19 1108  BP: 117/69 116/85  Pulse: 74 (!) 126  Resp:    Temp: 98.5 F (36.9 C)   SpO2: 96%    Vitals:   08/07/19 0855 08/07/19 1300 08/07/19 2153 08/08/19 1108  BP: 126/70 135/82 117/69 116/85  Pulse: 76 68 74 (!) 126  Resp:  19    Temp:  98.2 F (36.8 C) 98.5 F (36.9 C)   TempSrc:  Oral Oral   SpO2:  100% 96%   Weight:      Height:       General: Pt is not in acute distress  The results of significant diagnostics from this hospitalization (including imaging, microbiology, ancillary and laboratory) are listed below for reference.     Microbiology: Recent Results (from the past 240 hour(s))  Urine culture     Status: None   Collection Time: 08/03/19 11:12 AM   Specimen: Urine, Clean Catch  Result Value Ref Range Status   Specimen Description   Final    URINE, CLEAN CATCH Performed at [facility], [address], [place], [place] [address]    Special Requests   Final    Normal Performed at [facility], [address] [place], [place] [address]    Culture   Final    Multiple bacterial morphotypes present, none predominant. Suggest appropriate recollection if clinically indicated.   Report Status 08/04/2019 FINAL  Final  SARS Coronavirus 2 [facility] order, Performed in [facility] hospital lab) Nasopharyngeal Nasopharyngeal Swab     Status: None   Collection Time: 08/03/19 11:13 AM   Specimen: Nasopharyngeal Swab  Result Value Ref Range Status   SARS Coronavirus 2 NEGATIVE NEGATIVE Final    Comment: (NOTE) If result is NEGATIVE SARS-CoV-2 target nucleic acids are NOT DETECTED. The SARS-CoV-2 RNA is generally detectable in  upper and lower  respiratory specimens during the acute phase of infection. The lowest  concentration of SARS-CoV-2 viral copies this assay can detect is 250  copies / mL. A negative result does not preclude SARS-CoV-2 infection  and should not be used as the sole basis for treatment or other  patient management decisions.  A negative result may occur with  improper specimen collection / handling, submission of specimen other  than nasopharyngeal swab, presence of viral mutation(s) within the  areas targeted by this assay, and inadequate number of viral copies  (<250 copies / mL). A negative result must be combined with clinical  observations, patient history, and epidemiological information. If result is POSITIVE SARS-CoV-2 target nucleic acids are DETECTED. The SARS-CoV-2 RNA is generally detectable in upper and lower  respiratory specimens dur ing the acute phase of infection.  Positive  results are indicative of active infection with SARS-CoV-2.  Clinical  correlation with patient history and other diagnostic information is  necessary to determine patient infection status.  Positive results do  not rule out bacterial infection or co-infection with other viruses. If result is PRESUMPTIVE POSTIVE SARS-CoV-2 nucleic acids MAY BE PRESENT.   A presumptive positive result was obtained on the submitted specimen  and confirmed on repeat testing.  While 2019 novel coronavirus  (SARS-CoV-2) nucleic acids may be present in the submitted sample  additional confirmatory testing may be necessary for epidemiological  and / or clinical management purposes  to differentiate between  SARS-CoV-2 and other Sarbecovirus currently known to infect humans.  If clinically indicated additional testing with an alternate test  methodology [phone]) is advised. The SARS-CoV-2 RNA is generally  detectable in upper and lower respiratory sp ecimens during the acute  phase of infection. The expected result is  Negative. Fact Sheet for Patients:  StrictlyIdeas.no Fact Sheet for Healthcare Providers: BankingDealers.co.za This test is not yet approved or cleared by the [place] FDA and has been authorized for detection and/or diagnosis of SARS-CoV-2 by FDA under an Emergency Use Authorization (EUA).  This EUA will remain in effect (meaning this test can be used) for the duration of the COVID-19 declaration under Section 564(b)(1) of the Act, 21 U.S.C. section 360bbb-3(b)(1), unless the authorization is terminated or revoked sooner. Performed at [facility], [address], [place], [place] [address]   Culture, blood (routine x 2)     Status: None   Collection Time: 08/03/19 11:45 AM   Specimen: Right Antecubital; Blood  Result Value Ref Range Status   Specimen Description RIGHT ANTECUBITAL  Final   Special Requests   Final    BOTTLES DRAWN AEROBIC AND ANAEROBIC Blood Culture adequate volume   Culture   Final    NO GROWTH 5 DAYS Performed at [facility], [address], [place], [place] [address]    Report Status 08/08/2019 FINAL  Final  Culture, blood (routine x 2)     Status: None   Collection Time: 08/03/19 11:54 AM   Specimen: BLOOD LEFT FOREARM  Result Value Ref Range Status   Specimen Description BLOOD LEFT  FOREARM  Final   Special Requests   Final    BOTTLES DRAWN AEROBIC AND ANAEROBIC Blood Culture adequate volume   Culture   Final    NO GROWTH 5 DAYS Performed at [facility], [address] [place], [place] [address]    Report Status 08/08/2019 FINAL  Final  SARS Coronavirus 2 [facility] order, Performed in [facility] hospital lab) Nasopharyngeal Nasopharyngeal Swab     Status: None   Collection Time: 08/05/19  7:55 PM   Specimen: Nasopharyngeal Swab  Result Value Ref Range Status   SARS Coronavirus 2 NEGATIVE NEGATIVE Final    Comment: (NOTE) If result is NEGATIVE SARS-CoV-2 target nucleic acids are NOT DETECTED. The  SARS-CoV-2 RNA is generally detectable in upper and lower  respiratory specimens during the acute phase of infection. The lowest  concentration of SARS-CoV-2 viral copies this assay can detect is 250  copies / mL. A negative result does not preclude SARS-CoV-2 infection  and should not be used as the sole basis for treatment or other  patient management decisions.  A negative result may occur with  improper specimen collection / handling, submission of specimen other  than nasopharyngeal swab, presence of viral mutation(s) within the  areas targeted by this assay, and inadequate number of viral copies  (<250 copies / mL). A negative result must be combined with clinical  observations, patient history, and epidemiological information. If result is POSITIVE SARS-CoV-2 target nucleic acids are DETECTED. The SARS-CoV-2 RNA is generally detectable in upper and lower  respiratory specimens dur ing the acute phase of infection.  Positive  results are indicative of active infection with SARS-CoV-2.  Clinical  correlation with patient history and other diagnostic information is  necessary to determine patient infection status.  Positive results do  not rule out bacterial infection or co-infection with other viruses. If result is PRESUMPTIVE POSTIVE SARS-CoV-2 nucleic acids MAY BE PRESENT.   A presumptive positive result was obtained on the submitted specimen  and confirmed on repeat testing.  While 2019 novel coronavirus  (SARS-CoV-2) nucleic acids may be present in the submitted sample  additional confirmatory testing may be necessary for epidemiological  and / or clinical management purposes  to differentiate between  SARS-CoV-2 and other Sarbecovirus currently known to infect humans.  If clinically indicated additional testing with an alternate test  methodology [phone]) is advised. The SARS-CoV-2 RNA is generally  detectable in upper and lower respiratory sp ecimens during the acute   phase of infection. The expected result is Negative. Fact Sheet for Patients:  StrictlyIdeas.no Fact Sheet for Healthcare Providers: BankingDealers.co.za This test is not yet approved or cleared by the [place] FDA and has been authorized for detection and/or diagnosis of SARS-CoV-2 by FDA under an Emergency Use Authorization (EUA).  This EUA will remain in effect (meaning this test can be used) for the duration of the COVID-19 declaration under Section 564(b)(1) of the Act, 21 U.S.C. section 360bbb-3(b)(1), unless the authorization is terminated or revoked sooner. Performed at [facility], [address] [place], [place] [address]      Labs: BNP (last 3 results) No results for input(s): BNP in the last 8760 hours. Basic Metabolic Panel: Recent Labs  Lab 08/03/19 1111 08/04/19 0443 08/05/19 1040  NA 136 137 136  K 4.1 4.5 4.0  CL 94* 98 97*  CO2 29 28 28   GLUCOSE 111* 110* 132*  BUN 10 12 14   CREATININE 1.01 0.95 0.94  CALCIUM 9.2 8.8* 9.0  Liver Function Tests: Recent Labs  Lab 08/03/19 1111 08/05/19 1040  AST 14* 13*  ALT 14 11  ALKPHOS 68 59  BILITOT 1.4* 0.6  PROT 7.5 7.1  ALBUMIN 3.2* 3.0*   Recent Labs  Lab 08/03/19 1111 08/05/19 1040  LIPASE 20 17   No results for input(s): AMMONIA in the last 168 hours. CBC: Recent Labs  Lab 08/03/19 1111 08/04/19 0443 08/05/19 1040  WBC 19.9* 22.3* 21.7*  NEUTROABS 13.9*  --  16.5*  HGB 14.2 13.0 13.2  HCT 46.8 42.5 42.9  MCV 90.5 91.0 90.3  PLT 323 300 301   Cardiac Enzymes: No results for input(s): CKTOTAL, CKMB, CKMBINDEX, TROPONINI in the last 168 hours. BNP: Invalid input(s): POCBNP CBG: Recent Labs  Lab 08/03/19 2050 08/04/19 0722 08/04/19 1110  GLUCAP 97 86 144*   D-Dimer No results for input(s): DDIMER in the last 72 hours. Hgb A1c No results for input(s): HGBA1C in the last 72 hours. Lipid Profile No results for input(s): CHOL,  HDL, LDLCALC, TRIG, CHOLHDL, LDLDIRECT in the last 72 hours. Thyroid function studies No results for input(s): TSH, T4TOTAL, T3FREE, THYROIDAB in the last 72 hours.  Invalid input(s): FREET3 Anemia work up No results for input(s): VITAMINB12, FOLATE, FERRITIN, TIBC, IRON, RETICCTPCT in the last 72 hours. Urinalysis    Component Value Date/Time   COLORURINE YELLOW 08/05/2019 1705   APPEARANCEUR CLEAR 08/05/2019 1705   LABSPEC 1.029 08/05/2019 1705   PHURINE 5.0 08/05/2019 1705   GLUCOSEU NEGATIVE 08/05/2019 1705   HGBUR NEGATIVE 08/05/2019 1705   BILIRUBINUR NEGATIVE 08/05/2019 1705   KETONESUR NEGATIVE 08/05/2019 1705   PROTEINUR NEGATIVE 08/05/2019 1705   NITRITE NEGATIVE 08/05/2019 1705   LEUKOCYTESUR NEGATIVE 08/05/2019 1705   Sepsis Labs Invalid input(s): PROCALCITONIN,  WBC,  LACTICIDVEN Microbiology Recent Results (from the past 240 hour(s))  Urine culture     Status: None   Collection Time: 08/03/19 11:12 AM   Specimen: Urine, Clean Catch  Result Value Ref Range Status   Specimen Description   Final    URINE, CLEAN CATCH Performed at [facility], [address], [place], [place] [address]    Special Requests   Final    Normal Performed at [facility], [address], [place], [place] [address]    Culture   Final    Multiple bacterial morphotypes present, none predominant. Suggest appropriate recollection if clinically indicated.   Report Status 08/04/2019 FINAL  Final  SARS Coronavirus 2 [facility] order, Performed in [facility] hospital lab) Nasopharyngeal Nasopharyngeal Swab     Status: None   Collection Time: 08/03/19 11:13 AM   Specimen: Nasopharyngeal Swab  Result Value Ref Range Status   SARS Coronavirus 2 NEGATIVE NEGATIVE Final    Comment: (NOTE) If result is NEGATIVE SARS-CoV-2 target nucleic acids are NOT DETECTED. The SARS-CoV-2 RNA is generally detectable in upper and lower  respiratory specimens during the acute phase of infection. The lowest   concentration of SARS-CoV-2 viral copies this assay can detect is 250  copies / mL. A negative result does not preclude SARS-CoV-2 infection  and should not be used as the sole basis for treatment or other  patient management decisions.  A negative result may occur with  improper specimen collection / handling, submission of specimen other  than nasopharyngeal swab, presence of viral mutation(s) within the  areas targeted by this assay, and inadequate number of viral copies  (<250 copies / mL). A negative result must be combined with clinical  observations,  patient history, and epidemiological information. If result is POSITIVE SARS-CoV-2 target nucleic acids are DETECTED. The SARS-CoV-2 RNA is generally detectable in upper and lower  respiratory specimens dur ing the acute phase of infection.  Positive  results are indicative of active infection with SARS-CoV-2.  Clinical  correlation with patient history and other diagnostic information is  necessary to determine patient infection status.  Positive results do  not rule out bacterial infection or co-infection with other viruses. If result is PRESUMPTIVE POSTIVE SARS-CoV-2 nucleic acids MAY BE PRESENT.   A presumptive positive result was obtained on the submitted specimen  and confirmed on repeat testing.  While 2019 novel coronavirus  (SARS-CoV-2) nucleic acids may be present in the submitted sample  additional confirmatory testing may be necessary for epidemiological  and / or clinical management purposes  to differentiate between  SARS-CoV-2 and other Sarbecovirus currently known to infect humans.  If clinically indicated additional testing with an alternate test  methodology [phone]) is advised. The SARS-CoV-2 RNA is generally  detectable in upper and lower respiratory sp ecimens during the acute  phase of infection. The expected result is Negative. Fact Sheet for Patients:  StrictlyIdeas.no Fact Sheet  for Healthcare Providers: BankingDealers.co.za This test is not yet approved or cleared by the [place] FDA and has been authorized for detection and/or diagnosis of SARS-CoV-2 by FDA under an Emergency Use Authorization (EUA).  This EUA will remain in effect (meaning this test can be used) for the duration of the COVID-19 declaration under Section 564(b)(1) of the Act, 21 U.S.C. section 360bbb-3(b)(1), unless the authorization is terminated or revoked sooner. Performed at [facility], [address], [place], [place] [address]   Culture, blood (routine x 2)     Status: None   Collection Time: 08/03/19 11:45 AM   Specimen: Right Antecubital; Blood  Result Value Ref Range Status   Specimen Description RIGHT ANTECUBITAL  Final   Special Requests   Final    BOTTLES DRAWN AEROBIC AND ANAEROBIC Blood Culture adequate volume   Culture   Final    NO GROWTH 5 DAYS Performed at [facility], [address] [place], [place] [address]    Report Status 08/08/2019 FINAL  Final  Culture, blood (routine x 2)     Status: None   Collection Time: 08/03/19 11:54 AM   Specimen: BLOOD LEFT FOREARM  Result Value Ref Range Status   Specimen Description BLOOD LEFT FOREARM  Final   Special Requests   Final    BOTTLES DRAWN AEROBIC AND ANAEROBIC Blood Culture adequate volume   Culture   Final    NO GROWTH 5 DAYS Performed at [facility], [address] [place], [place] [address]    Report Status 08/08/2019 FINAL  Final  SARS Coronavirus 2 [facility] order, Performed in [facility] hospital lab) Nasopharyngeal Nasopharyngeal Swab     Status: None   Collection Time: 08/05/19  7:55 PM   Specimen: Nasopharyngeal Swab  Result Value Ref Range Status   SARS Coronavirus 2 NEGATIVE NEGATIVE Final    Comment: (NOTE) If result is NEGATIVE SARS-CoV-2 target nucleic acids are NOT DETECTED. The SARS-CoV-2 RNA is generally detectable in upper and lower  respiratory specimens during the  acute phase of infection. The lowest  concentration of SARS-CoV-2 viral copies this assay can detect is 250  copies / mL. A negative result does not preclude SARS-CoV-2 infection  and should not be used as the sole basis for treatment or other  patient management  decisions.  A negative result may occur with  improper specimen collection / handling, submission of specimen other  than nasopharyngeal swab, presence of viral mutation(s) within the  areas targeted by this assay, and inadequate number of viral copies  (<250 copies / mL). A negative result must be combined with clinical  observations, patient history, and epidemiological information. If result is POSITIVE SARS-CoV-2 target nucleic acids are DETECTED. The SARS-CoV-2 RNA is generally detectable in upper and lower  respiratory specimens dur ing the acute phase of infection.  Positive  results are indicative of active infection with SARS-CoV-2.  Clinical  correlation with patient history and other diagnostic information is  necessary to determine patient infection status.  Positive results do  not rule out bacterial infection or co-infection with other viruses. If result is PRESUMPTIVE POSTIVE SARS-CoV-2 nucleic acids MAY BE PRESENT.   A presumptive positive result was obtained on the submitted specimen  and confirmed on repeat testing.  While 2019 novel coronavirus  (SARS-CoV-2) nucleic acids may be present in the submitted sample  additional confirmatory testing may be necessary for epidemiological  and / or clinical management purposes  to differentiate between  SARS-CoV-2 and other Sarbecovirus currently known to infect humans.  If clinically indicated additional testing with an alternate test  methodology [phone]) is advised. The SARS-CoV-2 RNA is generally  detectable in upper and lower respiratory sp ecimens during the acute  phase of infection. The expected result is Negative. Fact Sheet for Patients:   StrictlyIdeas.no Fact Sheet for Healthcare Providers: BankingDealers.co.za This test is not yet approved or cleared by the [place] FDA and has been authorized for detection and/or diagnosis of SARS-CoV-2 by FDA under an Emergency Use Authorization (EUA).  This EUA will remain in effect (meaning this test can be used) for the duration of the COVID-19 declaration under Section 564(b)(1) of the Act, 21 U.S.C. section 360bbb-3(b)(1), unless the authorization is terminated or revoked sooner. Performed at [facility], [address], [place], [place] [address]    Time coordinating discharge:   SIGNED:  Irwin Brakeman, MD  [facility] 08/08/2019, 11:21 AM How to contact the [facility] Attending or Consulting provider [place] or covering provider during after hours [place], for this patient?  1. Check the care team in [facility] and look for a) attending/consulting TRH provider listed and b) the [facility] team listed 2. Log into [email or web address] and use [place] universal password to access. If you do not have the password, please contact the hospital operator. 3. Locate the [facility] provider you are looking for under [facility] and page to a number that you can be directly reached. 4. If you still have difficulty reaching the provider, please page the [facility] (Director on Call) for the Hospitalists listed on amion for assistance.

## 2019-08-08 NOTE — TOC Progression Note (Signed)
Transition of Care [facility]) - Progression Note    Patient Details  Name: Andrew Roman MRN: [id number] Date of Birth: [birth year]  Transition [facility] CM/SW Contact  Shade Flood, LCSW Phone Number: 08/08/2019, 11:00 AM  Clinical Narrative:     TOC following. Pt is currently admitted to [facility] inpatient status. Spoke with Cassandra at [facility] today to update. Pt and family interested in [place] placement. Per Cassandra, pt is likely appropriate though they do not currently have a bed available. She states that a Hospice RN will be to the hospital between 2-3 today to assess pt for GIP. If/when approved, Dr. Wynetta Emery can dc and readmit to GIP hospice status. Updated MD. Will follow.  Expected Discharge Plan: Home w Hospice Care Barriers to Discharge: No Barriers Identified  Expected Discharge Plan and Services Expected Discharge Plan: [place]   Discharge Planning Services: CM Consult   Living arrangements for the past 2 months: Apartment Expected Discharge Date: 08/08/19                                     Social Determinants of Health (SDOH) Interventions    Readmission Risk Interventions No flowsheet data found.

## 2019-08-09 DIAGNOSIS — Z515 Encounter for palliative care: Secondary | ICD-10-CM

## 2019-08-09 DIAGNOSIS — C799 Secondary malignant neoplasm of unspecified site: Secondary | ICD-10-CM

## 2019-08-09 NOTE — Discharge Summary (Signed)
Physician Discharge Summary  Andrew Roman YHC:[id number] DOB: 09-Jun-1946 DOA: 08/08/2019  PCP: [facility]  Admit date: 08/08/2019 Discharge date: 08/09/2019 Disposition: RESIDENTIAL HOSPICE  Recommendations  1. SYMPTOM MANAGEMENT PER HOSPICE PROTOCOL   Discharge Condition: HOSPICE   CODE STATUS: DNR    Brief Summary: Please see all hospital notes, images, labs for full details of the hospitalization. Brief Admission Hx: 73 y.o.malewith past medical history significant for hypertension, diabetes mellitus, chronic respiratory failure on 5 L nasal cannula at baseline, interstitial lung disease, recent diagnosis of metastatic lung malignancy, was recently hospitalized secondary to encephalopathy and now presents with uncontrolled pain and family not able to care for at home and requesting direct placement to [place]:   1. Comfort care status - Pt more lethargic today but appears comfortable and denies any pain or discomfort.   Continue comfort care plan and TRANSFER TO RESIDENTIAL HOSPICE.  SYMPTOM MANAGEMENT PER HOSPICE PROTOCOL.   Discharge Diagnoses:  Principal Problem:   Metastatic cancer [facility]) Active Problems:   Comfort measures only status  Discharge Instructions:  Allergies as of 08/09/2019      Reactions   Morphine And Related    Son says pt doesn't like the way its make him feel      Medication List    STOP taking these medications   aspirin EC 81 MG tablet   atenolol 25 MG tablet Commonly known as: TENORMIN   docusate sodium 100 MG capsule Commonly known as: Colace   HYDROcodone-Acetaminophen 10-300 MG Tabs   insulin NPH-regular Human (70-30) 100 UNIT/ML injection   multivitamin tablet   omeprazole 40 MG capsule Commonly known as: PRILOSEC   ondansetron 4 MG disintegrating tablet Commonly known as: Zofran ODT   polyethylene glycol 17 g packet Commonly known as: MiraLax   predniSONE 5 MG  tablet Commonly known as: DELTASONE   simvastatin 10 MG tablet Commonly known as: ZOCOR       Allergies  Allergen Reactions  . Morphine And Related     Son says pt doesn't like the way its make him feel   Allergies as of 08/09/2019      Reactions   Morphine And Related    Son says pt doesn't like the way its make him feel      Medication List    STOP taking these medications   aspirin EC 81 MG tablet   atenolol 25 MG tablet Commonly known as: TENORMIN   docusate sodium 100 MG capsule Commonly known as: Colace   HYDROcodone-Acetaminophen 10-300 MG Tabs   insulin NPH-regular Human (70-30) 100 UNIT/ML injection   multivitamin tablet   omeprazole 40 MG capsule Commonly known as: PRILOSEC   ondansetron 4 MG disintegrating tablet Commonly known as: Zofran ODT   polyethylene glycol 17 g packet Commonly known as: MiraLax   predniSONE 5 MG tablet Commonly known as: DELTASONE   simvastatin 10 MG tablet Commonly known as: ZOCOR       Procedures/Studies: Ct Head W & Wo Contrast  Result Date: 08/04/2019 CLINICAL DATA:  Metastatic carcinoma. EXAM: CT HEAD WITHOUT AND WITH CONTRAST TECHNIQUE: Contiguous axial images were obtained from the base of the skull through the vertex without and with intravenous contrast CONTRAST:  36mL OMNIPAQUE IOHEXOL 300 MG/ML  SOLN COMPARISON:  None. FINDINGS: Brain: Mild generalized atrophy. Chronic small-vessel ischemic changes of the cerebral hemispheric white matter. No sign of recent infarction, intra-axial mass lesion, hemorrhage, hydrocephalus or extra-axial collection. No abnormal enhancement  occurs. Vascular: There is atherosclerotic calcification of the major vessels at the base of the brain. Skull: Negative Sinuses/Orbits: Clear/normal Other: None IMPRESSION: No evidence of metastatic disease to the brain, leptomeninges or skull. Atrophy and chronic small-vessel ischemic change of the white matter. Electronically Signed   By: Nelson Chimes M.D.   On: 08/04/2019 15:07   Ct Angio Chest Pe W And/or Wo Contrast  Result Date: 08/03/2019 CLINICAL DATA:  Right-sided chest pain. EXAM: CT ANGIOGRAPHY CHEST WITH CONTRAST TECHNIQUE: Multidetector CT imaging of the chest was performed using the standard protocol during bolus administration of intravenous contrast. Multiplanar CT image reconstructions and MIPs were obtained to evaluate the vascular anatomy. CONTRAST:  136mL OMNIPAQUE IOHEXOL 350 MG/ML SOLN COMPARISON:  Chest x-ray dated 08/03/2019 FINDINGS: Cardiovascular: There are no pulmonary emboli. However, there is a posterior mediastinal mass at the level of the left atrium which invades the left atrium. There is a 3 x 2 x 2 cm irregular mass in the posterior aspect of the left atrium representing a direct extension of the tumor in the posterior mediastinum into the atrium. Overall heart size is normal. No pericardial effusion. Slight coronary artery calcifications. Mediastinum/Nodes: There is right hilar and subcarinal adenopathy. There is a 6.6 x 6.6 x 4.4 cm posterior mediastinal mass immediately posterior to the left atrium extending to the diaphragmatic hiatus immediately adjacent to the esophagus. The fat planes between the distal esophagus and this mass are obscured. The thyroid gland and trachea appear normal. Lungs/Pleura: The mass in the posterior mediastinum extends into the left hilum. There is narrowing of the right middle lobe and lower lobe bronchi. There is peripheral interstitial disease throughout both lungs, most prominent posteriorly at the right lung base. No discrete effusions. Upper Abdomen: There is adenopathy around the porta hepatis and around the inferior vena cava just below the liver. Musculoskeletal: There is a slight anterior wedge deformity of there is also a 9 mm lytic lesion in the superior aspect of T7, likely a benign Schmorl's node. PET scan does show metastatic disease in the sclerotic left pedicle of T7.  There is a destructive expansile lesion of the anterior aspect of the right third rib. The PET scan demonstrated numerous metastatic lesions in the ribs and spine as well as in the scapulae. There is a subtle lytic lesion in the posteromedial aspect of the left ninth rib. Review of the MIP images confirms the above findings. IMPRESSION: 1. No pulmonary emboli. 2. Large mass in the posterior mediastinum extending into the left hilum with invasion of the left atrium. 3. Right hilar and subcarinal adenopathy consistent with metastatic disease. 4. Metastatic disease to the bones as described. 5. Probable benign Schmorl's node in the superior aspect of the T7 vertebral body. Electronically Signed   By: Lorriane Shire M.D.   On: 08/03/2019 15:37   Ct Abdomen Pelvis W Contrast  Result Date: 08/03/2019 CLINICAL DATA:  Acute generalized abdominal pain. Foot EXAM: CT ABDOMEN AND PELVIS WITH CONTRAST TECHNIQUE: Multidetector CT imaging of the abdomen and pelvis was performed using the standard protocol following bolus administration of intravenous contrast. CONTRAST:  153mL OMNIPAQUE IOHEXOL 350 MG/ML SOLN COMPARISON:  None. FINDINGS: Lower chest: Posterior mediastinal mass invading the left atrium. Adenopathy adjacent to the distal esophagus just above the diaphragmatic hiatus. Hepatobiliary: There are 3 discrete metastases in the liver which correlate with the PET-CT scan. There is a 2 cm mass in the posterior aspect of the dome of the right lobe of the  liver. There is a 17 mm lesion in the periphery of the right lobe and there is a 16 mm lesion in the inferior aspect of the left lobe of the liver. There is a single calcified gallstone. No dilated bile ducts. Prominent caudate lobe. Pancreas: Normal. Spleen: Normal. 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 Left adrenal gland appears normal. Bilateral renal cysts as demonstrated on the prior PET-CT scan. No hydronephrosis. Bladder appears normal.  Stomach/Bowel: Stomach is within normal limits. Appendix appears normal. No evidence of bowel wall thickening, distention, or inflammatory changes. Multiple diverticula in the distal colon. The appendix is not discretely identified. Vascular/Lymphatic: Aortic atherosclerosis. Multiple enlarged lymph nodes in the porta hepatis and adjacent to the celiac axis. Reproductive: Prostate is unremarkable. Other: No abdominal wall hernia or abnormality. No abdominopelvic ascites. Musculoskeletal: The multiple metastatic lesions in the spine, pelvic bones, and proximal left femur are quite subtle on the CT scan. While the proximal left femur lesions has a vague area of increased density best seen on image 102 of series 5. No discrete destructive bone lesions. IMPRESSION: 1. 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 2. Multiple enlarged lymph nodes in the porta hepatis and adjacent to the celiac axis. 3. Multiple metastatic lesions in the spine, pelvic bones, and proximal left femur. 4. No acute abnormalities in the abdomen as compared to the prior PET-CT. Aortic Atherosclerosis (ICD10-I70.0). Electronically Signed   By: Lorriane Shire M.D.   On: 08/03/2019 15:47   Nm Pet Image Initial (pi) Skull Base To Thigh  Result Date: 08/01/2019 CLINICAL DATA:  Initial treatment strategy for right lower lobe lung malignancy. EXAM: NUCLEAR MEDICINE PET SKULL BASE TO THIGH TECHNIQUE: 12.2 mCi F-18 FDG was injected intravenously. Full-ring PET imaging was performed from the skull base to thigh after the radiotracer. CT data was obtained and used for attenuation correction and anatomic localization. Fasting blood glucose: 110 mg/dl COMPARISON:  None. FINDINGS: Mediastinal blood pool activity: SUV max 3.3 Liver activity: SUV max NA NECK: Hypermetabolic mildly enlarged bilateral level II neck lymph nodes, for example measuring 1.0 cm on the left with max SUV 4.8 (series 3/image 36). Small focus of hypermetabolism in  the right submandibular musculature with max SUV 7.0, without discrete CT correlate. Incidental CT findings: none CHEST: Hypermetabolic solid 5.7 x 4.8 cm lung mass in the paramediastinal right lower lobe with probable direct mediastinal invasion with max SUV 16.4 (series 3/image 88). There is patchy irregular thickening of peribronchovascular interstitium throughout both lungs without additional foci of pulmonary hypermetabolism. Hypermetabolic 0.7 cm right axillary node with max SUV 3.9 (series 3/image 58). Hypermetabolic enlarged 1.3 cm subcarinal node with max SUV 7.7 (series 3/image 75). No hypermetabolic left axillary or hilar nodes. Hypermetabolic 1.1 cm soft tissue nodule posterior to the right pectoralis musculature with max SUV 5.3 (series 3/image 95). Incidental CT findings: Coronary atherosclerosis. Mildly atherosclerotic nonaneurysmal thoracic aorta. ABDOMEN/PELVIS: Three hypermetabolic liver lesions scattered in the liver, including a 1.3 cm posterior right liver lesion with max SUV 9.4 (series 3/image 100), a posterior right liver dome 1.3 cm lesion with max SUV 7.6 (series 3/image 87) and a 1.1 cm segment 3 left liver lobe lesion with max SUV 7.7 (series 3/image 116). Hypermetabolic 2.4 cm right adrenal nodule with max SUV 15.7 (series 3/image 117). No hypermetabolic left adrenal nodules. Two hypermetabolic soft tissue implants in the omentum, largest 1.4 cm in the right omentum with max SUV 8.4 (series 3/image 159). Hypermetabolic high right retroperitoneal 1.2  cm lymph node with max SUV 4.9 (series 3/image 117). No additional hypermetabolic abdominopelvic nodes. Bilateral medial gluteal musculature soft tissue foci of hypermetabolism, largest 1.1 cm on the left with max SUV 7.4 (series 3/image 253). No abnormal hypermetabolic activity within the pancreas or spleen. Incidental CT findings: Cholelithiasis. Atherosclerotic nonaneurysmal abdominal aorta. Punctate nonobstructing interpolar right renal  stone. Mild sigmoid diverticulosis. SKELETON: Numerous hypermetabolic osseous lesions scattered throughout the axial and proximal appendicular skeleton, which are relatively CT occult. Representative lesions as follows: -inferior right scapular lesion with max SUV 11.6 -anterior left third rib lesion with max SUV 13.5 -proximal right humeral metaphysis lesion with max SUV 10.6 -right iliac crest lesion with max SUV 8.6 -left femoral neck lesion with max SUV 9.2 -L1 vertebral lesion with max SUV 13.4 Incidental CT findings: none IMPRESSION: 1. Intensely hypermetabolic (max SUV 67.1) 5.7 cm lung mass in the paramediastinal right lower lobe, compatible with primary bronchogenic carcinoma, with probable direct mediastinal invasion. 2. Hypermetabolic subcarinal lymphadenopathy. 3. Irregular patchy thickening of the peribronchovascular interstitium throughout both lungs, nonspecific, lymphangitic carcinomatosis not excluded. 4. Hypermetabolic distant lymphadenopathy in the bilateral level II neck, right axilla and high right retroperitoneum. 5. Multiple hypermetabolic liver metastases. 6. Hypermetabolic right adrenal metastasis. 7. Hypermetabolic omental metastases. 8. Scattered hypermetabolic soft tissue metastases in right chest wall and bilateral gluteal musculature. 9. Widespread hypermetabolic osseous metastatic disease throughout the axial and proximal appendicular skeleton. Hypermetabolic left femoral neck metastasis places the patient at risk for pathologic fracture. 10. PET/CT staging is stage IVB (T4 N2 M1c). 11. Chronic findings include: Aortic Atherosclerosis (ICD10-I70.0). Coronary atherosclerosis. Cholelithiasis. Mild sigmoid diverticulosis. Electronically Signed   By: Ilona Sorrel M.D.   On: 08/01/2019 22:18   Dg Chest Portable 1 View  Result Date: 08/03/2019 CLINICAL DATA:  Chest pain. EXAM: PORTABLE CHEST 1 VIEW COMPARISON:  None. FINDINGS: Mild cardiomegaly is noted. Hypoinflation of the lungs is  noted. No pneumothorax is noted. Small pleural effusions may be present. Bilateral lung opacities are noted concerning for possible edema or inflammation. Bony thorax is unremarkable. IMPRESSION: Hypoinflation of the lungs is noted. Small pleural effusions may be present. Bilateral interstitial lung opacities are noted which may represent edema or inflammation. Electronically Signed   By: Marijo Conception M.D.   On: 08/03/2019 11:40     Discharge Exam: Vitals:   08/09/19 0820 08/09/19 1340  BP:  99/75  Pulse:  (!) 113  Resp:  19  Temp:  98.1 F (36.7 C)  SpO2: 99% 97%   Vitals:   08/08/19 2222 08/09/19 0640 08/09/19 0820 08/09/19 1340  BP: 125/85 123/83  99/75  Pulse: (!) 102 (!) 106  (!) 113  Resp: 20 18  19   Temp: 99.4 F (37.4 C) 98.9 F (37.2 C)  98.1 F (36.7 C)  TempSrc: Oral Oral    SpO2: 99% 100% 99% 97%    The results of significant diagnostics from this hospitalization (including imaging, microbiology, ancillary and laboratory) are listed below for reference.     Microbiology: Recent Results (from the past 240 hour(s))  Urine culture     Status: None   Collection Time: 08/03/19 11:12 AM   Specimen: Urine, Clean Catch  Result Value Ref Range Status   Specimen Description   Final    URINE, CLEAN CATCH Performed at [facility], [address] [place], [place] [address]    Special Requests   Final    Normal Performed at [facility], [address] [place], [place] [address]  Culture   Final    Multiple bacterial morphotypes present, none predominant. Suggest appropriate recollection if clinically indicated.   Report Status 08/04/2019 FINAL  Final  SARS Coronavirus 2 [facility] order, Performed in [facility] hospital lab) Nasopharyngeal Nasopharyngeal Swab     Status: None   Collection Time: 08/03/19 11:13 AM   Specimen: Nasopharyngeal Swab  Result Value Ref Range Status   SARS Coronavirus 2 NEGATIVE NEGATIVE Final    Comment: (NOTE) If result is  NEGATIVE SARS-CoV-2 target nucleic acids are NOT DETECTED. The SARS-CoV-2 RNA is generally detectable in upper and lower  respiratory specimens during the acute phase of infection. The lowest  concentration of SARS-CoV-2 viral copies this assay can detect is 250  copies / mL. A negative result does not preclude SARS-CoV-2 infection  and should not be used as the sole basis for treatment or other  patient management decisions.  A negative result may occur with  improper specimen collection / handling, submission of specimen other  than nasopharyngeal swab, presence of viral mutation(s) within the  areas targeted by this assay, and inadequate number of viral copies  (<250 copies / mL). A negative result must be combined with clinical  observations, patient history, and epidemiological information. If result is POSITIVE SARS-CoV-2 target nucleic acids are DETECTED. The SARS-CoV-2 RNA is generally detectable in upper and lower  respiratory specimens dur ing the acute phase of infection.  Positive  results are indicative of active infection with SARS-CoV-2.  Clinical  correlation with patient history and other diagnostic information is  necessary to determine patient infection status.  Positive results do  not rule out bacterial infection or co-infection with other viruses. If result is PRESUMPTIVE POSTIVE SARS-CoV-2 nucleic acids MAY BE PRESENT.   A presumptive positive result was obtained on the submitted specimen  and confirmed on repeat testing.  While 2019 novel coronavirus  (SARS-CoV-2) nucleic acids may be present in the submitted sample  additional confirmatory testing may be necessary for epidemiological  and / or clinical management purposes  to differentiate between  SARS-CoV-2 and other Sarbecovirus currently known to infect humans.  If clinically indicated additional testing with an alternate test  methodology [phone]) is advised. The SARS-CoV-2 RNA is generally  detectable  in upper and lower respiratory sp ecimens during the acute  phase of infection. The expected result is Negative. Fact Sheet for Patients:  StrictlyIdeas.no Fact Sheet for Healthcare Providers: BankingDealers.co.za This test is not yet approved or cleared by the [place] FDA and has been authorized for detection and/or diagnosis of SARS-CoV-2 by FDA under an Emergency Use Authorization (EUA).  This EUA will remain in effect (meaning this test can be used) for the duration of the COVID-19 declaration under Section 564(b)(1) of the Act, 21 U.S.C. section 360bbb-3(b)(1), unless the authorization is terminated or revoked sooner. Performed at [facility], [address] [place], [place] [address]   Culture, blood (routine x 2)     Status: None   Collection Time: 08/03/19 11:45 AM   Specimen: Right Antecubital; Blood  Result Value Ref Range Status   Specimen Description RIGHT ANTECUBITAL  Final   Special Requests   Final    BOTTLES DRAWN AEROBIC AND ANAEROBIC Blood Culture adequate volume   Culture   Final    NO GROWTH 5 DAYS Performed at [facility], [address] [place], [place] [address]    Report Status 08/08/2019 FINAL  Final  Culture, blood (routine x 2)     Status:  None   Collection Time: 08/03/19 11:54 AM   Specimen: BLOOD LEFT FOREARM  Result Value Ref Range Status   Specimen Description BLOOD LEFT FOREARM  Final   Special Requests   Final    BOTTLES DRAWN AEROBIC AND ANAEROBIC Blood Culture adequate volume   Culture   Final    NO GROWTH 5 DAYS Performed at [facility], [address], [place], [place] [address]    Report Status 08/08/2019 FINAL  Final  SARS Coronavirus 2 [facility] order, Performed in [facility] hospital lab) Nasopharyngeal Nasopharyngeal Swab     Status: None   Collection Time: 08/05/19  7:55 PM   Specimen: Nasopharyngeal Swab  Result Value Ref Range Status   SARS Coronavirus 2 NEGATIVE NEGATIVE  Final    Comment: (NOTE) If result is NEGATIVE SARS-CoV-2 target nucleic acids are NOT DETECTED. The SARS-CoV-2 RNA is generally detectable in upper and lower  respiratory specimens during the acute phase of infection. The lowest  concentration of SARS-CoV-2 viral copies this assay can detect is 250  copies / mL. A negative result does not preclude SARS-CoV-2 infection  and should not be used as the sole basis for treatment or other  patient management decisions.  A negative result may occur with  improper specimen collection / handling, submission of specimen other  than nasopharyngeal swab, presence of viral mutation(s) within the  areas targeted by this assay, and inadequate number of viral copies  (<250 copies / mL). A negative result must be combined with clinical  observations, patient history, and epidemiological information. If result is POSITIVE SARS-CoV-2 target nucleic acids are DETECTED. The SARS-CoV-2 RNA is generally detectable in upper and lower  respiratory specimens dur ing the acute phase of infection.  Positive  results are indicative of active infection with SARS-CoV-2.  Clinical  correlation with patient history and other diagnostic information is  necessary to determine patient infection status.  Positive results do  not rule out bacterial infection or co-infection with other viruses. If result is PRESUMPTIVE POSTIVE SARS-CoV-2 nucleic acids MAY BE PRESENT.   A presumptive positive result was obtained on the submitted specimen  and confirmed on repeat testing.  While 2019 novel coronavirus  (SARS-CoV-2) nucleic acids may be present in the submitted sample  additional confirmatory testing may be necessary for epidemiological  and / or clinical management purposes  to differentiate between  SARS-CoV-2 and other Sarbecovirus currently known to infect humans.  If clinically indicated additional testing with an alternate test  methodology [phone]) is advised. The  SARS-CoV-2 RNA is generally  detectable in upper and lower respiratory sp ecimens during the acute  phase of infection. The expected result is Negative. Fact Sheet for Patients:  StrictlyIdeas.no Fact Sheet for Healthcare Providers: BankingDealers.co.za This test is not yet approved or cleared by the [place] FDA and has been authorized for detection and/or diagnosis of SARS-CoV-2 by FDA under an Emergency Use Authorization (EUA).  This EUA will remain in effect (meaning this test can be used) for the duration of the COVID-19 declaration under Section 564(b)(1) of the Act, 21 U.S.C. section 360bbb-3(b)(1), unless the authorization is terminated or revoked sooner. Performed at [facility], [address] [place], [place] [address]      Labs: BNP (last 3 results) No results for input(s): BNP in the last 8760 hours. Basic Metabolic Panel: Recent Labs  Lab 08/03/19 1111 08/04/19 0443 08/05/19 1040  NA 136 137 136  K 4.1 4.5 4.0  CL 94* 98 97*  CO2 29 28 28   GLUCOSE 111* 110* 132*  BUN 10 12 14   CREATININE 1.01 0.95 0.94  CALCIUM 9.2 8.8* 9.0   Liver Function Tests: Recent Labs  Lab 08/03/19 1111 08/05/19 1040  AST 14* 13*  ALT 14 11  ALKPHOS 68 59  BILITOT 1.4* 0.6  PROT 7.5 7.1  ALBUMIN 3.2* 3.0*   Recent Labs  Lab 08/03/19 1111 08/05/19 1040  LIPASE 20 17   No results for input(s): AMMONIA in the last 168 hours. CBC: Recent Labs  Lab 08/03/19 1111 08/04/19 0443 08/05/19 1040  WBC 19.9* 22.3* 21.7*  NEUTROABS 13.9*  --  16.5*  HGB 14.2 13.0 13.2  HCT 46.8 42.5 42.9  MCV 90.5 91.0 90.3  PLT 323 300 301   Cardiac Enzymes: No results for input(s): CKTOTAL, CKMB, CKMBINDEX, TROPONINI in the last 168 hours. BNP: Invalid input(s): POCBNP CBG: Recent Labs  Lab 08/03/19 2050 08/04/19 0722 08/04/19 1110  GLUCAP 97 86 144*   D-Dimer No results for input(s): DDIMER in the last 72 hours. Hgb  A1c No results for input(s): HGBA1C in the last 72 hours. Lipid Profile No results for input(s): CHOL, HDL, LDLCALC, TRIG, CHOLHDL, LDLDIRECT in the last 72 hours. Thyroid function studies No results for input(s): TSH, T4TOTAL, T3FREE, THYROIDAB in the last 72 hours.  Invalid input(s): FREET3 Anemia work up No results for input(s): VITAMINB12, FOLATE, FERRITIN, TIBC, IRON, RETICCTPCT in the last 72 hours. Urinalysis    Component Value Date/Time   COLORURINE YELLOW 08/05/2019 1705   APPEARANCEUR CLEAR 08/05/2019 1705   LABSPEC 1.029 08/05/2019 1705   PHURINE 5.0 08/05/2019 1705   GLUCOSEU NEGATIVE 08/05/2019 1705   HGBUR NEGATIVE 08/05/2019 1705   BILIRUBINUR NEGATIVE 08/05/2019 1705   KETONESUR NEGATIVE 08/05/2019 1705   PROTEINUR NEGATIVE 08/05/2019 1705   NITRITE NEGATIVE 08/05/2019 1705   LEUKOCYTESUR NEGATIVE 08/05/2019 1705   Sepsis Labs Invalid input(s): PROCALCITONIN,  WBC,  LACTICIDVEN Microbiology Recent Results (from the past 240 hour(s))  Urine culture     Status: None   Collection Time: 08/03/19 11:12 AM   Specimen: Urine, Clean Catch  Result Value Ref Range Status   Specimen Description   Final    URINE, CLEAN CATCH Performed at [facility], [address], [place], [place] [address]    Special Requests   Final    Normal Performed at [facility], [address], [place], [place] [address]    Culture   Final    Multiple bacterial morphotypes present, none predominant. Suggest appropriate recollection if clinically indicated.   Report Status 08/04/2019 FINAL  Final  SARS Coronavirus 2 [facility] order, Performed in [facility] hospital lab) Nasopharyngeal Nasopharyngeal Swab     Status: None   Collection Time: 08/03/19 11:13 AM   Specimen: Nasopharyngeal Swab  Result Value Ref Range Status   SARS Coronavirus 2 NEGATIVE NEGATIVE Final    Comment: (NOTE) If result is NEGATIVE SARS-CoV-2 target nucleic acids are NOT DETECTED. The SARS-CoV-2 RNA is generally  detectable in upper and lower  respiratory specimens during the acute phase of infection. The lowest  concentration of SARS-CoV-2 viral copies this assay can detect is 250  copies / mL. A negative result does not preclude SARS-CoV-2 infection  and should not be used as the sole basis for treatment or other  patient management decisions.  A negative result may occur with  improper specimen collection / handling, submission of specimen other  than nasopharyngeal swab, presence of viral mutation(s) within the  areas targeted by this assay, and inadequate number of viral copies  (<250 copies / mL). A negative result must be combined with clinical  observations, patient history, and epidemiological information. If result is POSITIVE SARS-CoV-2 target nucleic acids are DETECTED. The SARS-CoV-2 RNA is generally detectable in upper and lower  respiratory specimens dur ing the acute phase of infection.  Positive  results are indicative of active infection with SARS-CoV-2.  Clinical  correlation with patient history and other diagnostic information is  necessary to determine patient infection status.  Positive results do  not rule out bacterial infection or co-infection with other viruses. If result is PRESUMPTIVE POSTIVE SARS-CoV-2 nucleic acids MAY BE PRESENT.   A presumptive positive result was obtained on the submitted specimen  and confirmed on repeat testing.  While 2019 novel coronavirus  (SARS-CoV-2) nucleic acids may be present in the submitted sample  additional confirmatory testing may be necessary for epidemiological  and / or clinical management purposes  to differentiate between  SARS-CoV-2 and other Sarbecovirus currently known to infect humans.  If clinically indicated additional testing with an alternate test  methodology [phone]) is advised. The SARS-CoV-2 RNA is generally  detectable in upper and lower respiratory sp ecimens during the acute  phase of infection. The  expected result is Negative. Fact Sheet for Patients:  StrictlyIdeas.no Fact Sheet for Healthcare Providers: BankingDealers.co.za This test is not yet approved or cleared by the [place] FDA and has been authorized for detection and/or diagnosis of SARS-CoV-2 by FDA under an Emergency Use Authorization (EUA).  This EUA will remain in effect (meaning this test can be used) for the duration of the COVID-19 declaration under Section 564(b)(1) of the Act, 21 U.S.C. section 360bbb-3(b)(1), unless the authorization is terminated or revoked sooner. Performed at [facility], [address] [place], [place] [address]   Culture, blood (routine x 2)     Status: None   Collection Time: 08/03/19 11:45 AM   Specimen: Right Antecubital; Blood  Result Value Ref Range Status   Specimen Description RIGHT ANTECUBITAL  Final   Special Requests   Final    BOTTLES DRAWN AEROBIC AND ANAEROBIC Blood Culture adequate volume   Culture   Final    NO GROWTH 5 DAYS Performed at [facility], [address] [place], [place] [address]    Report Status 08/08/2019 FINAL  Final  Culture, blood (routine x 2)     Status: None   Collection Time: 08/03/19 11:54 AM   Specimen: BLOOD LEFT FOREARM  Result Value Ref Range Status   Specimen Description BLOOD LEFT FOREARM  Final   Special Requests   Final    BOTTLES DRAWN AEROBIC AND ANAEROBIC Blood Culture adequate volume   Culture   Final    NO GROWTH 5 DAYS Performed at [facility], [address], [place], [place] [address]    Report Status 08/08/2019 FINAL  Final  SARS Coronavirus 2 [facility] order, Performed in [facility] hospital lab) Nasopharyngeal Nasopharyngeal Swab     Status: None   Collection Time: 08/05/19  7:55 PM   Specimen: Nasopharyngeal Swab  Result Value Ref Range Status   SARS Coronavirus 2 NEGATIVE NEGATIVE Final    Comment: (NOTE) If result is NEGATIVE SARS-CoV-2 target nucleic acids are NOT  DETECTED. The SARS-CoV-2 RNA is generally detectable in upper and lower  respiratory specimens during the acute phase of infection. The lowest  concentration of SARS-CoV-2 viral copies this assay can detect is 250  copies /  mL. A negative result does not preclude SARS-CoV-2 infection  and should not be used as the sole basis for treatment or other  patient management decisions.  A negative result may occur with  improper specimen collection / handling, submission of specimen other  than nasopharyngeal swab, presence of viral mutation(s) within the  areas targeted by this assay, and inadequate number of viral copies  (<250 copies / mL). A negative result must be combined with clinical  observations, patient history, and epidemiological information. If result is POSITIVE SARS-CoV-2 target nucleic acids are DETECTED. The SARS-CoV-2 RNA is generally detectable in upper and lower  respiratory specimens dur ing the acute phase of infection.  Positive  results are indicative of active infection with SARS-CoV-2.  Clinical  correlation with patient history and other diagnostic information is  necessary to determine patient infection status.  Positive results do  not rule out bacterial infection or co-infection with other viruses. If result is PRESUMPTIVE POSTIVE SARS-CoV-2 nucleic acids MAY BE PRESENT.   A presumptive positive result was obtained on the submitted specimen  and confirmed on repeat testing.  While 2019 novel coronavirus  (SARS-CoV-2) nucleic acids may be present in the submitted sample  additional confirmatory testing may be necessary for epidemiological  and / or clinical management purposes  to differentiate between  SARS-CoV-2 and other Sarbecovirus currently known to infect humans.  If clinically indicated additional testing with an alternate test  methodology [phone]) is advised. The SARS-CoV-2 RNA is generally  detectable in upper and lower respiratory sp ecimens during  the acute  phase of infection. The expected result is Negative. Fact Sheet for Patients:  StrictlyIdeas.no Fact Sheet for Healthcare Providers: BankingDealers.co.za This test is not yet approved or cleared by the [place] FDA and has been authorized for detection and/or diagnosis of SARS-CoV-2 by FDA under an Emergency Use Authorization (EUA).  This EUA will remain in effect (meaning this test can be used) for the duration of the COVID-19 declaration under Section 564(b)(1) of the Act, 21 U.S.C. section 360bbb-3(b)(1), unless the authorization is terminated or revoked sooner. Performed at [facility], [address] [place], [place] [address]     Time coordinating discharge:   SIGNED:  Irwin Brakeman, MD  [facility] 08/09/2019, 2:27 PM How to contact the [facility] Attending or Consulting provider [place] or covering provider during after hours [place], for this patient?  1. Check the care team in [facility] and look for a) attending/consulting TRH provider listed and b) the [facility] team listed 2. Log into [email or web address] and use [place] universal password to access. If you do not have the password, please contact the hospital operator. 3. Locate the [facility] provider you are looking for under [facility] and page to a number that you can be directly reached. 4. If you still have difficulty reaching the provider, please page the [facility] (Director on Call) for the Hospitalists listed on amion for assistance.

## 2019-08-09 NOTE — Progress Notes (Signed)
PROGRESS NOTE    Andrew Roman  OIZ:[id number]  DOB: [birth year]  DOA: 08/08/2019 PCP: [facility]  Brief Admission Hx: 73 y.o. malewith past medical history significant for hypertension, diabetes mellitus, chronic respiratory failure on 5 L nasal cannula at baseline, interstitial lung disease, recent diagnosis of metastatic lung malignancy, was recently hospitalized secondary to encephalopathy and now presents with uncontrolled pain and family not able to care for at home and requesting direct placement to [place]:   1. Comfort care status - Pt more lethargic today but appears comfortable and denies any pain or discomfort.   Continue comfort care plan and GIP status.   Subjective: Pt without specific complaints.   Objective: Vitals:   08/08/19 1949 08/08/19 2222 08/09/19 0640 08/09/19 0820  BP:  125/85 123/83   Pulse:  (!) 102 (!) 106   Resp:  20 18   Temp:  99.4 F (37.4 C) 98.9 F (37.2 C)   TempSrc:  Oral Oral   SpO2: 96% 99% 100% 99%    Intake/Output Summary (Last 24 hours) at 08/09/2019 1155 Last data filed at 08/09/2019 0900 Gross per 24 hour  Intake 480 ml  Output 400 ml  Net 80 ml   There were no vitals filed for this visit.  REVIEW OF SYSTEMS  As per history otherwise all reviewed and reported negative  Exam:  General exam: awake, lethargic but easily arousable, NAD. Cooperative.  Respiratory system: Clear. No increased work of breathing. Cardiovascular system: S1 & S2 heard. Mild tachycardia. No JVD, murmurs, gallops, clicks or pedal edema. Gastrointestinal system: Abdomen is nondistended, soft and nontender. Normal bowel sounds heard. Central nervous system: Alert. No focal neurological deficits. Extremities: no CCE.  Data Reviewed: Basic Metabolic Panel: Recent Labs  Lab 08/03/19 1111 08/04/19 0443 08/05/19 1040  NA 136 137 136  K 4.1 4.5 4.0  CL 94* 98 97*  CO2 29 28 28   GLUCOSE 111* 110*  132*  BUN 10 12 14   CREATININE 1.01 0.95 0.94  CALCIUM 9.2 8.8* 9.0   Liver Function Tests: Recent Labs  Lab 08/03/19 1111 08/05/19 1040  AST 14* 13*  ALT 14 11  ALKPHOS 68 59  BILITOT 1.4* 0.6  PROT 7.5 7.1  ALBUMIN 3.2* 3.0*   Recent Labs  Lab 08/03/19 1111 08/05/19 1040  LIPASE 20 17   No results for input(s): AMMONIA in the last 168 hours. CBC: Recent Labs  Lab 08/03/19 1111 08/04/19 0443 08/05/19 1040  WBC 19.9* 22.3* 21.7*  NEUTROABS 13.9*  --  16.5*  HGB 14.2 13.0 13.2  HCT 46.8 42.5 42.9  MCV 90.5 91.0 90.3  PLT 323 300 301   Cardiac Enzymes: No results for input(s): CKTOTAL, CKMB, CKMBINDEX, TROPONINI in the last 168 hours. CBG (last 3)  No results for input(s): GLUCAP in the last 72 hours. Recent Results (from the past 240 hour(s))  Urine culture     Status: None   Collection Time: 08/03/19 11:12 AM   Specimen: Urine, Clean Catch  Result Value Ref Range Status   Specimen Description   Final    URINE, CLEAN CATCH Performed at [facility], [address] [place], [place] [address]    Special Requests   Final    Normal Performed at [facility], [address], [place], [place] [address]    Culture   Final    Multiple bacterial morphotypes present, none predominant. Suggest appropriate recollection if clinically indicated.   Report Status 08/04/2019  FINAL  Final  SARS Coronavirus 2 [facility] order, Performed in [facility] hospital lab) Nasopharyngeal Nasopharyngeal Swab     Status: None   Collection Time: 08/03/19 11:13 AM   Specimen: Nasopharyngeal Swab  Result Value Ref Range Status   SARS Coronavirus 2 NEGATIVE NEGATIVE Final    Comment: (NOTE) If result is NEGATIVE SARS-CoV-2 target nucleic acids are NOT DETECTED. The SARS-CoV-2 RNA is generally detectable in upper and lower  respiratory specimens during the acute phase of infection. The lowest  concentration of SARS-CoV-2 viral copies this assay can detect is 250  copies / mL. A  negative result does not preclude SARS-CoV-2 infection  and should not be used as the sole basis for treatment or other  patient management decisions.  A negative result may occur with  improper specimen collection / handling, submission of specimen other  than nasopharyngeal swab, presence of viral mutation(s) within the  areas targeted by this assay, and inadequate number of viral copies  (<250 copies / mL). A negative result must be combined with clinical  observations, patient history, and epidemiological information. If result is POSITIVE SARS-CoV-2 target nucleic acids are DETECTED. The SARS-CoV-2 RNA is generally detectable in upper and lower  respiratory specimens dur ing the acute phase of infection.  Positive  results are indicative of active infection with SARS-CoV-2.  Clinical  correlation with patient history and other diagnostic information is  necessary to determine patient infection status.  Positive results do  not rule out bacterial infection or co-infection with other viruses. If result is PRESUMPTIVE POSTIVE SARS-CoV-2 nucleic acids MAY BE PRESENT.   A presumptive positive result was obtained on the submitted specimen  and confirmed on repeat testing.  While 2019 novel coronavirus  (SARS-CoV-2) nucleic acids may be present in the submitted sample  additional confirmatory testing may be necessary for epidemiological  and / or clinical management purposes  to differentiate between  SARS-CoV-2 and other Sarbecovirus currently known to infect humans.  If clinically indicated additional testing with an alternate test  methodology [phone]) is advised. The SARS-CoV-2 RNA is generally  detectable in upper and lower respiratory sp ecimens during the acute  phase of infection. The expected result is Negative. Fact Sheet for Patients:  StrictlyIdeas.no Fact Sheet for Healthcare Providers: BankingDealers.co.za This test is not  yet approved or cleared by the [place] FDA and has been authorized for detection and/or diagnosis of SARS-CoV-2 by FDA under an Emergency Use Authorization (EUA).  This EUA will remain in effect (meaning this test can be used) for the duration of the COVID-19 declaration under Section 564(b)(1) of the Act, 21 U.S.C. section 360bbb-3(b)(1), unless the authorization is terminated or revoked sooner. Performed at [facility], [address], [place], [place] [address]   Culture, blood (routine x 2)     Status: None   Collection Time: 08/03/19 11:45 AM   Specimen: Right Antecubital; Blood  Result Value Ref Range Status   Specimen Description RIGHT ANTECUBITAL  Final   Special Requests   Final    BOTTLES DRAWN AEROBIC AND ANAEROBIC Blood Culture adequate volume   Culture   Final    NO GROWTH 5 DAYS Performed at [facility], [address], [place], [place] [address]    Report Status 08/08/2019 FINAL  Final  Culture, blood (routine x 2)     Status: None   Collection Time: 08/03/19 11:54 AM   Specimen: BLOOD LEFT FOREARM  Result Value Ref Range Status   Specimen Description  BLOOD LEFT FOREARM  Final   Special Requests   Final    BOTTLES DRAWN AEROBIC AND ANAEROBIC Blood Culture adequate volume   Culture   Final    NO GROWTH 5 DAYS Performed at [facility], [address], [place], [place] [address]    Report Status 08/08/2019 FINAL  Final  SARS Coronavirus 2 [facility] order, Performed in [facility] hospital lab) Nasopharyngeal Nasopharyngeal Swab     Status: None   Collection Time: 08/05/19  7:55 PM   Specimen: Nasopharyngeal Swab  Result Value Ref Range Status   SARS Coronavirus 2 NEGATIVE NEGATIVE Final    Comment: (NOTE) If result is NEGATIVE SARS-CoV-2 target nucleic acids are NOT DETECTED. The SARS-CoV-2 RNA is generally detectable in upper and lower  respiratory specimens during the acute phase of infection. The lowest  concentration of SARS-CoV-2 viral copies this  assay can detect is 250  copies / mL. A negative result does not preclude SARS-CoV-2 infection  and should not be used as the sole basis for treatment or other  patient management decisions.  A negative result may occur with  improper specimen collection / handling, submission of specimen other  than nasopharyngeal swab, presence of viral mutation(s) within the  areas targeted by this assay, and inadequate number of viral copies  (<250 copies / mL). A negative result must be combined with clinical  observations, patient history, and epidemiological information. If result is POSITIVE SARS-CoV-2 target nucleic acids are DETECTED. The SARS-CoV-2 RNA is generally detectable in upper and lower  respiratory specimens dur ing the acute phase of infection.  Positive  results are indicative of active infection with SARS-CoV-2.  Clinical  correlation with patient history and other diagnostic information is  necessary to determine patient infection status.  Positive results do  not rule out bacterial infection or co-infection with other viruses. If result is PRESUMPTIVE POSTIVE SARS-CoV-2 nucleic acids MAY BE PRESENT.   A presumptive positive result was obtained on the submitted specimen  and confirmed on repeat testing.  While 2019 novel coronavirus  (SARS-CoV-2) nucleic acids may be present in the submitted sample  additional confirmatory testing may be necessary for epidemiological  and / or clinical management purposes  to differentiate between  SARS-CoV-2 and other Sarbecovirus currently known to infect humans.  If clinically indicated additional testing with an alternate test  methodology [phone]) is advised. The SARS-CoV-2 RNA is generally  detectable in upper and lower respiratory sp ecimens during the acute  phase of infection. The expected result is Negative. Fact Sheet for Patients:  StrictlyIdeas.no Fact Sheet for Healthcare Providers:  BankingDealers.co.za This test is not yet approved or cleared by the [place] FDA and has been authorized for detection and/or diagnosis of SARS-CoV-2 by FDA under an Emergency Use Authorization (EUA).  This EUA will remain in effect (meaning this test can be used) for the duration of the COVID-19 declaration under Section 564(b)(1) of the Act, 21 U.S.C. section 360bbb-3(b)(1), unless the authorization is terminated or revoked sooner. Performed at [facility], [address] [place], [place] [address]     Studies: No results found.  Scheduled Meds: Continuous Infusions:  Principal Problem:   Metastatic cancer ([place]) Active Problems:   Comfort measures only status  Time spent:   Irwin Brakeman, MD [facility] 08/09/2019, 11:55 AM    LOS: 1 day  How to contact the [facility] Attending or Consulting provider [place] or covering provider during after hours [place], for this patient?  1. Check the care team in [facility] and look for a) attending/consulting TRH provider listed and b) the [facility] team listed 2. Log into [email or web address] and use [place] universal password to access. If you do not have the password, please contact the hospital operator. 3. Locate the [facility] provider you are looking for under [facility] and page to a number that you can be directly reached. 4. If you still have difficulty reaching the provider, please page the [facility] (Director on Call) for the Hospitalists listed on amion for assistance.

## 2019-08-09 NOTE — TOC Transition Note (Signed)
Transition of Care [facility]) - CM/SW Discharge Note   Patient Details  Name: Andrew Roman MRN: [id number] Date of Birth: 1946/05/17  [facility] CM/SW Contact:  Ihor Gully, LCSW Phone Number: 08/09/2019, 2:48 PM   Clinical Narrative:    Hospice has a bed for patient. Hospice notified son who is agreeable to transport to Hospice facility.  Attending notified. RN notified. EMS transport arranged.  LCSW signing off.        Patient Goals and CMS Choice        Discharge Placement                       Discharge Plan and Services                                     Social Determinants of Health (SDOH) Interventions     Readmission Risk Interventions No flowsheet data found.

## 2019-08-18 DEATH — deceased

## 2020-05-23 IMAGING — CT CT ABD-PELV W/ CM
2 of 9 series · 12 of 46 positions shown, 18 images · IV contrast (omnipaque)
Comparison: None.

CLINICAL DATA: Acute generalized abdominal pain. Foot

EXAM:
CT ABDOMEN AND PELVIS WITH CONTRAST
TECHNIQUE: Multidetector CT imaging of the abdomen and pelvis was performed
using the standard protocol following bolus administration of
intravenous contrast.
CONTRAST:  100mL OMNIPAQUE IOHEXOL 350 MG/ML SOLN

[Series 5: abd/pel w/ · axial · 0.90mm/px · z∈[-744,-279]mm · 11 of 111 slices shown, 16 images]
[im 9/111  soft-tissue]
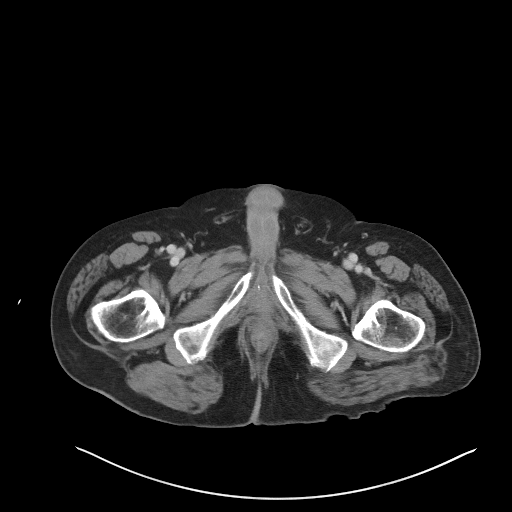
[im 9/111  bone]
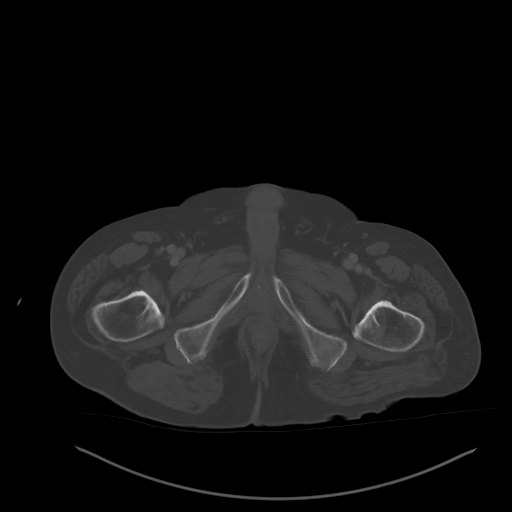
[im 17/111  soft-tissue]
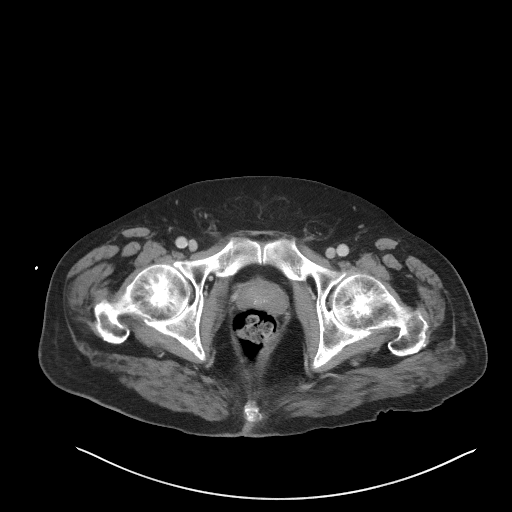
[im 34/111  soft-tissue]
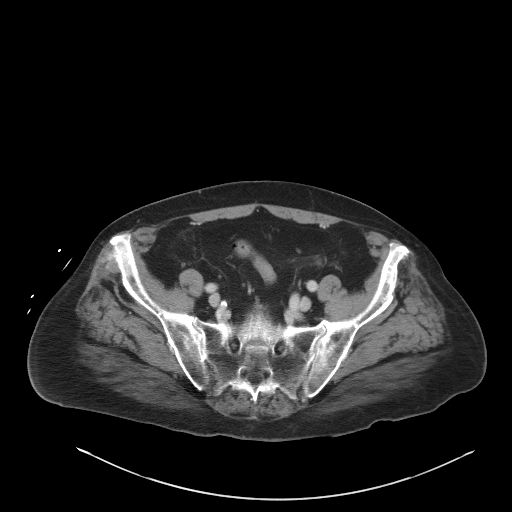
[im 43/111  soft-tissue]
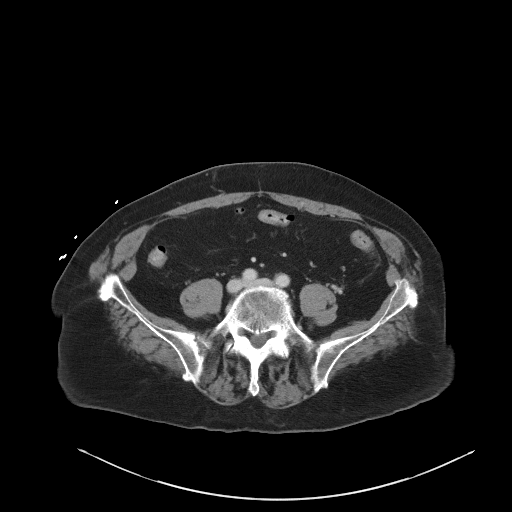
[im 51/111  soft-tissue]
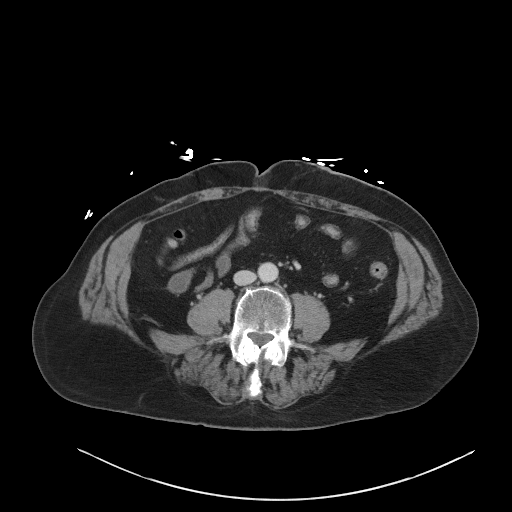
[im 60/111  soft-tissue]
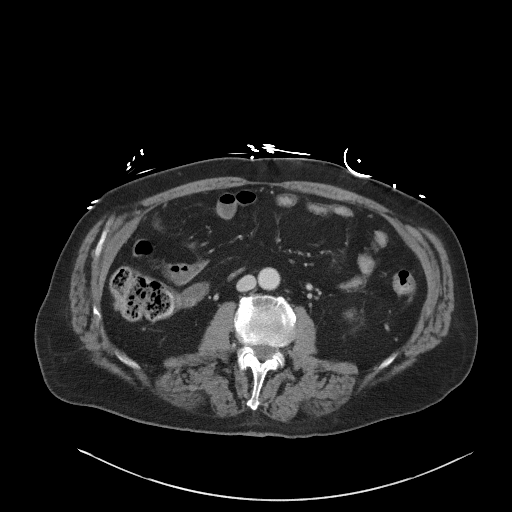
[im 68/111  soft-tissue]
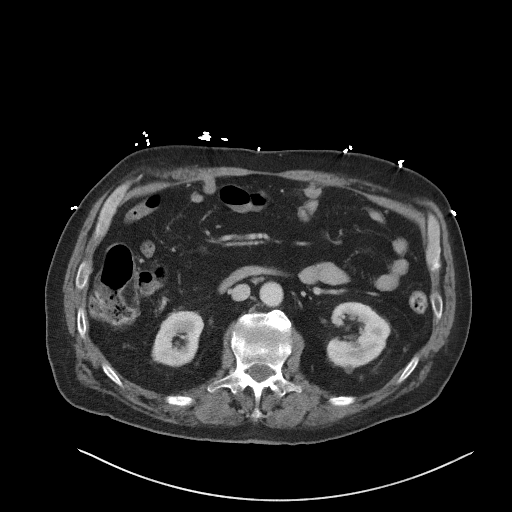
[im 77/111  lung]
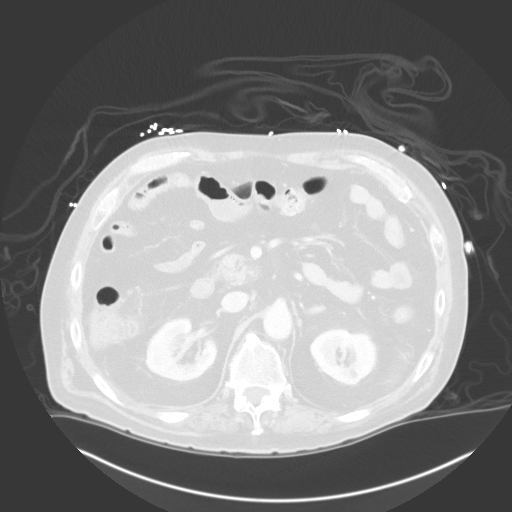
[im 85/111  soft-tissue]
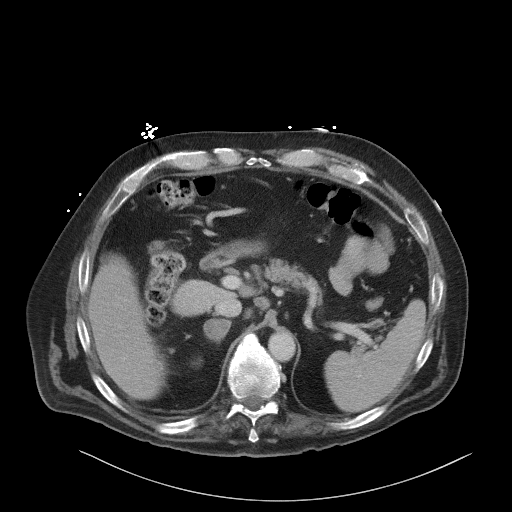
[im 85/111  lung]
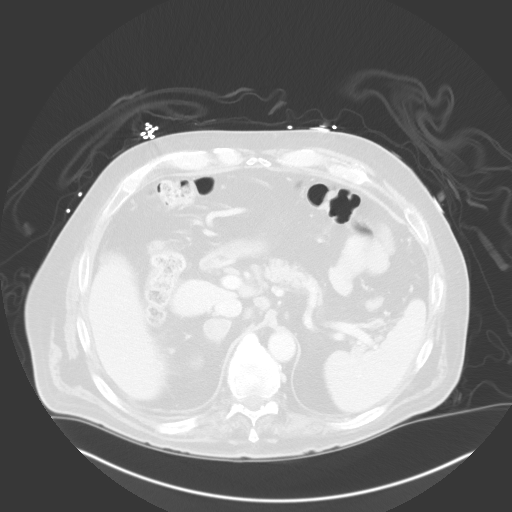
[im 94/111  soft-tissue]
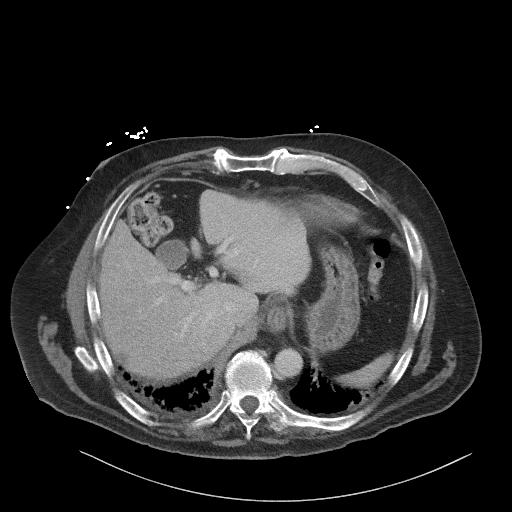
[im 94/111  lung]
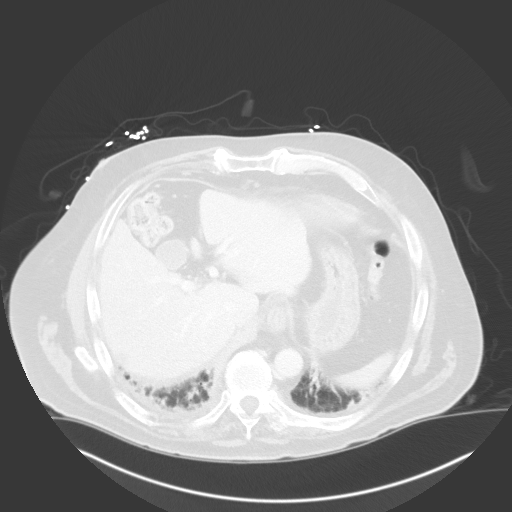
[im 94/111  bone]
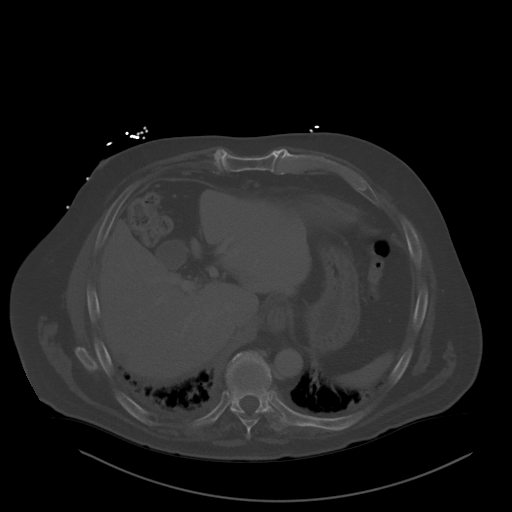
[im 102/111  soft-tissue]
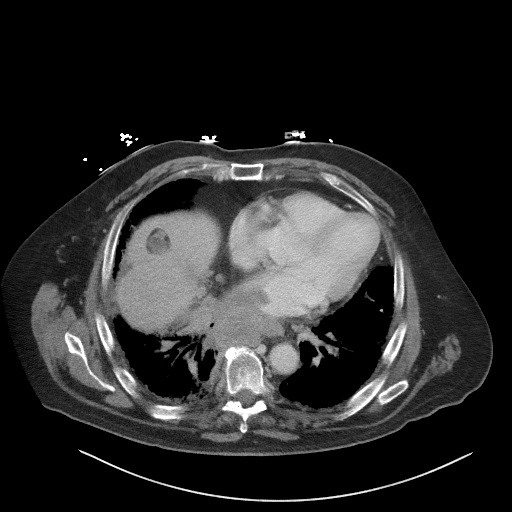
[im 102/111  lung]
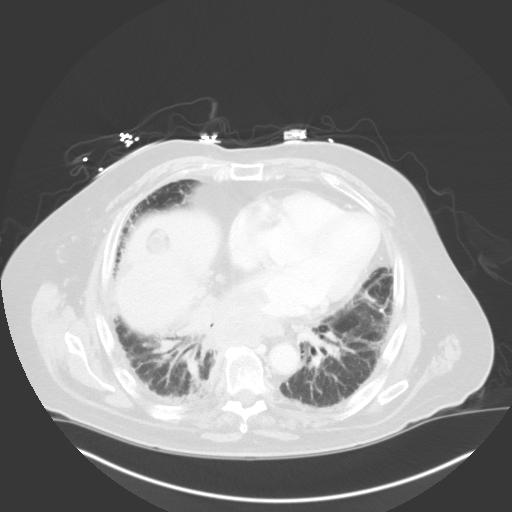

[Series 9: cor soft · coronal · 0.49mm/px · 1 of 151 slices shown, 2 images]
[im 76/151  soft-tissue]
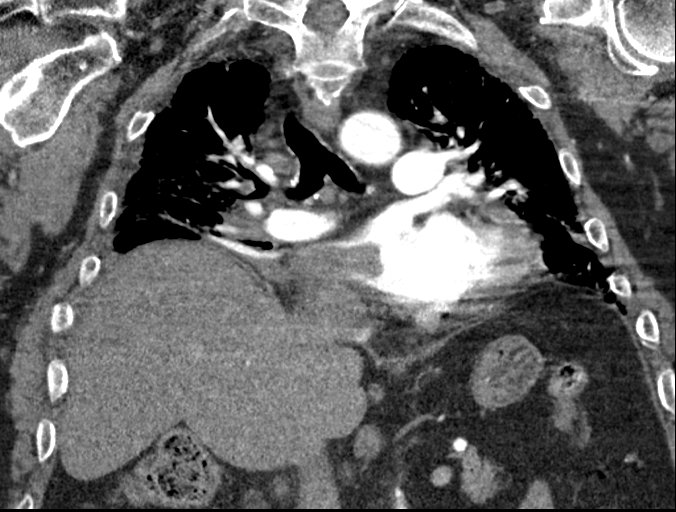
[im 76/151  bone]
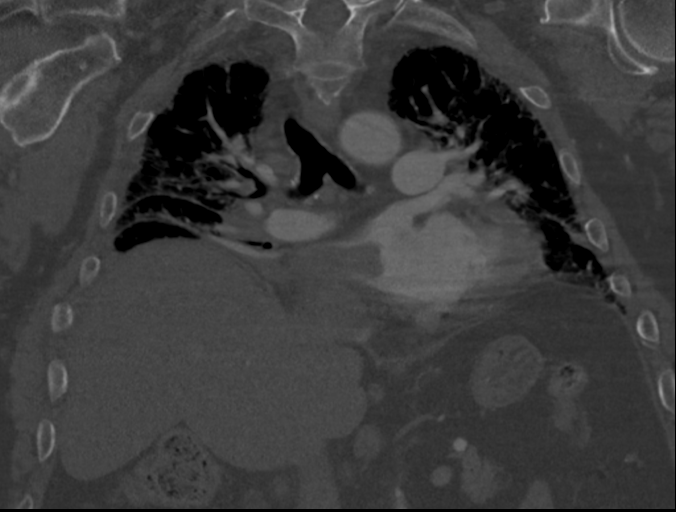

[12 of 46 positions shown; findings below may reference images not displayed]

FINDINGS: Lower chest: Posterior mediastinal mass invading the left atrium.
Adenopathy adjacent to the distal esophagus just above the
diaphragmatic hiatus.

Hepatobiliary: There are 3 discrete metastases in the liver which
correlate with the PET-CT scan. There is a 2 cm mass in the
posterior aspect of the dome of the right lobe of the liver. There
is a 17 mm lesion in the periphery of the right lobe and there is a
16 mm lesion in the inferior aspect of the left lobe of the liver.

There is a single calcified gallstone. No dilated bile ducts.
Prominent caudate lobe.

Pancreas: Normal.

Spleen: Normal.

Adrenals/Urinary Tract: 2.6 cm right adrenal metastasis. Left
adrenal gland appears normal. Bilateral renal cysts as demonstrated
on the prior PET-CT scan. No hydronephrosis. Bladder appears normal.

Stomach/Bowel: Stomach is within normal limits. Appendix appears
normal. No evidence of bowel wall thickening, distention, or
inflammatory changes. Multiple diverticula in the distal colon. The
appendix is not discretely identified.

Vascular/Lymphatic: Aortic atherosclerosis. Multiple enlarged lymph
nodes in the porta hepatis and adjacent to the celiac axis.

Reproductive: Prostate is unremarkable.

Other: No abdominal wall hernia or abnormality. No abdominopelvic
ascites.

Musculoskeletal: The multiple metastatic lesions in the spine,
pelvic bones, and proximal left femur are quite subtle on the CT
scan. While the proximal left femur lesions has a vague area of
increased density best seen on image 102 of series 5. No discrete
destructive bone lesions.
IMPRESSION: 1. Multiple metastatic lesions in the liver and right adrenal gland.
2. Multiple enlarged lymph nodes in the porta hepatis and adjacent
to the celiac axis.
3. Multiple metastatic lesions in the spine, pelvic bones, and
proximal left femur.
4. No acute abnormalities in the abdomen as compared to the prior
PET-CT.

Aortic Atherosclerosis ([id number]).
